# Patient Record
Sex: Female | Born: 1980 | Race: White | Hispanic: No | Marital: Married | Smoking: Never smoker
Health system: Southern US, Community
[De-identification: ages and names within clinical notes are randomized; demographics above are authoritative.]

## PROBLEM LIST (undated history)

## (undated) DIAGNOSIS — G43909 Migraine, unspecified, not intractable, without status migrainosus: Secondary | ICD-10-CM

## (undated) DIAGNOSIS — F419 Anxiety disorder, unspecified: Secondary | ICD-10-CM

## (undated) DIAGNOSIS — Z8041 Family history of malignant neoplasm of ovary: Secondary | ICD-10-CM

## (undated) DIAGNOSIS — E7800 Pure hypercholesterolemia, unspecified: Secondary | ICD-10-CM

## (undated) DIAGNOSIS — Z8049 Family history of malignant neoplasm of other genital organs: Secondary | ICD-10-CM

## (undated) DIAGNOSIS — R7989 Other specified abnormal findings of blood chemistry: Secondary | ICD-10-CM

## (undated) DIAGNOSIS — Z1589 Genetic susceptibility to other disease: Secondary | ICD-10-CM

## (undated) HISTORY — DX: Migraine, unspecified, not intractable, without status migrainosus: G43.909

## (undated) HISTORY — DX: Family history of malignant neoplasm of ovary: Z80.41

## (undated) HISTORY — DX: Pure hypercholesterolemia, unspecified: E78.00

## (undated) HISTORY — PX: DILATION AND CURETTAGE OF UTERUS: SHX78

## (undated) HISTORY — DX: Anxiety disorder, unspecified: F41.9

## (undated) HISTORY — PX: CHOLECYSTECTOMY: SHX55

## (undated) HISTORY — DX: Genetic susceptibility to other disease: Z15.89

## (undated) HISTORY — DX: Other specified abnormal findings of blood chemistry: R79.89

## (undated) HISTORY — DX: Family history of malignant neoplasm of other genital organs: Z80.49

---

## 2000-06-25 ENCOUNTER — Other Ambulatory Visit: Admission: RE | Admit: 2000-06-25 | Discharge: 2000-06-25 | Payer: Self-pay | Admitting: Obstetrics and Gynecology

## 2001-07-04 ENCOUNTER — Other Ambulatory Visit: Admission: RE | Admit: 2001-07-04 | Discharge: 2001-07-04 | Payer: Self-pay | Admitting: Obstetrics and Gynecology

## 2002-07-07 ENCOUNTER — Other Ambulatory Visit: Admission: RE | Admit: 2002-07-07 | Discharge: 2002-07-07 | Payer: Self-pay | Admitting: Obstetrics and Gynecology

## 2003-04-15 ENCOUNTER — Inpatient Hospital Stay (HOSPITAL_COMMUNITY): Admission: AD | Admit: 2003-04-15 | Discharge: 2003-04-15 | Payer: Self-pay | Admitting: Obstetrics and Gynecology

## 2003-08-18 ENCOUNTER — Other Ambulatory Visit: Admission: RE | Admit: 2003-08-18 | Discharge: 2003-08-18 | Payer: Self-pay | Admitting: Obstetrics and Gynecology

## 2004-02-29 ENCOUNTER — Inpatient Hospital Stay (HOSPITAL_COMMUNITY): Admission: AD | Admit: 2004-02-29 | Discharge: 2004-03-02 | Payer: Self-pay | Admitting: Obstetrics and Gynecology

## 2004-02-29 ENCOUNTER — Encounter (INDEPENDENT_AMBULATORY_CARE_PROVIDER_SITE_OTHER): Payer: Self-pay | Admitting: *Deleted

## 2004-03-03 ENCOUNTER — Encounter: Admission: RE | Admit: 2004-03-03 | Discharge: 2004-04-01 | Payer: Self-pay | Admitting: Obstetrics and Gynecology

## 2004-08-08 ENCOUNTER — Other Ambulatory Visit: Admission: RE | Admit: 2004-08-08 | Discharge: 2004-08-08 | Payer: Self-pay | Admitting: Obstetrics and Gynecology

## 2005-03-16 ENCOUNTER — Encounter: Admission: RE | Admit: 2005-03-16 | Discharge: 2005-05-02 | Payer: Self-pay | Admitting: Family Medicine

## 2007-11-13 ENCOUNTER — Ambulatory Visit: Admission: AD | Admit: 2007-11-13 | Discharge: 2007-11-13 | Payer: Self-pay | Admitting: Obstetrics and Gynecology

## 2007-11-13 ENCOUNTER — Encounter (INDEPENDENT_AMBULATORY_CARE_PROVIDER_SITE_OTHER): Payer: Self-pay | Admitting: Obstetrics and Gynecology

## 2008-08-28 ENCOUNTER — Emergency Department (HOSPITAL_COMMUNITY): Admission: EM | Admit: 2008-08-28 | Discharge: 2008-08-29 | Payer: Self-pay | Admitting: Emergency Medicine

## 2009-05-03 ENCOUNTER — Ambulatory Visit: Payer: Self-pay | Admitting: Advanced Practice Midwife

## 2009-05-03 ENCOUNTER — Inpatient Hospital Stay (HOSPITAL_COMMUNITY): Admission: AD | Admit: 2009-05-03 | Discharge: 2009-05-03 | Payer: Self-pay | Admitting: Obstetrics and Gynecology

## 2009-05-26 ENCOUNTER — Inpatient Hospital Stay (HOSPITAL_COMMUNITY): Admission: AD | Admit: 2009-05-26 | Discharge: 2009-05-28 | Payer: Self-pay | Admitting: Obstetrics and Gynecology

## 2010-04-12 LAB — CBC
HCT: 32.6 % — ABNORMAL LOW (ref 36.0–46.0)
Hemoglobin: 11.1 g/dL — ABNORMAL LOW (ref 12.0–15.0)
MCV: 83.3 fL (ref 78.0–100.0)
MCV: 84.6 fL (ref 78.0–100.0)
Platelets: 164 10*3/uL (ref 150–400)
Platelets: 232 10*3/uL (ref 150–400)
RBC: 3.85 MIL/uL — ABNORMAL LOW (ref 3.87–5.11)
RDW: 15 % (ref 11.5–15.5)
RDW: 15 % (ref 11.5–15.5)
WBC: 8.2 10*3/uL (ref 4.0–10.5)

## 2010-04-12 LAB — RPR: RPR Ser Ql: NONREACTIVE

## 2010-06-07 NOTE — Op Note (Signed)
NAMERETINA, BERNARDY NO.:  [id number]   MEDICAL RECORD NO.:  [id number]          PATIENT TYPE:  AMB   LOCATION:  DFTL                          FACILITY:  [facility]   PHYSICIAN:  Randye Lobo, M.D.   DATE OF BIRTH:  1980/07/21   DATE OF PROCEDURE:  11/13/2007  DATE OF DISCHARGE:                               OPERATIVE REPORT   PREOPERATIVE DIAGNOSIS:  Missed abortion.   POSTOPERATIVE DIAGNOSIS:  Missed abortion.   PROCEDURE:  Dilation and evacuation.   SURGEON:  Randye Lobo, MD   ANESTHESIA:  MAC, paracervical block with 1% lidocaine.   IV FLUIDS:  600 mL Ringer's lactate.   ESTIMATED BLOOD LOSS:  Minimal.   URINE OUTPUT:  100 mL by I&O catheterization.   COMPLICATIONS:  None.   INDICATIONS FOR THE PROCEDURE:  The patient is a 30 year old gravida 3,  para 1-0-1-1 Caucasian female with a last menstrual period, September 28, 2007, and a known early pregnancy who has had fluctuating quantitative  beta-HCG levels.  The levels are as follows:  698.5 on November 08, 2007,  886.8 on November 10, 2007, and 767.2 on November 12, 2007.  The patient  has had serial ultrasounds with her most recent ultrasound on November 11, 2007, which documented a questionable intrauterine gestational sac  measuring 4+2 weeks in size.  The adnexal regions were unremarkable and  there was no evidence of any free fluid.  The patient has had some  suprapubic/left lower quadrant tenderness.  A plan is now made to  proceed with a dilation and evacuation procedure after risks, benefits,  and alternatives are reviewed.   FINDINGS:  Exam under anesthesia revealed a small slightly retroverted  uterus.  A small amount of products of conception were obtained.   PROCEDURE:  The patient was reidentified in the maternity admissions  area.  She did receive Ancef 1 g IV for antibiotic prophylaxis.   In the operating room, a MAC anesthetic was induced and the patient was  then placed in the  dorsal lithotomy position.  The lower abdomen and  vagina were sterilely prepped and the bladder was catheterized of urine.  She was sterilely draped.   An exam under anesthesia was performed.  A speculum was placed inside  the vagina.  A single-tooth tenaculum was placed on the anterior  cervical lip.  A paracervical block was performed in standard fashion  with a total of 10 mL of 1% lidocaine plain.  The uterus was then  sounded to 8 cm.  The cervix was then sequentially dilated to a #21  Pratt dilator.  The #7 suction-tip curette was then introduced through  the cervical os to the level of the uterine fundus then was withdrawn  slightly.  Proper suction was applied and while turning in a clockwise  fashion, the suction-tip curette was removed from within the uterine  cavity.  This was repeated an additional time.  A serrated curette was  then used to gently curette and examined the 4 quadrants of the uterus,  and there were no remaining products of conception appreciated.  The  suction-tip curette was introduced into the uterine cavity one final  time and proper suction was applied and the instrument was withdrawn  while turning it again in a clockwise fashion to remove any remaining  blood clots.  This was very minimal.   The single-tooth tenaculum was removed from the cervix.  There was some  bleeding noted from the tenaculum site and this responded to compression  with a ring forceps.  Hemostasis was then good.  All of the instruments  were removed from the vagina and the procedure was completed.  The  patient was cleansed with Betadine.  She was awakened and escorted to  the recovery room in stable and awake condition.  There were no  complications to the procedure.  All needle, instrument, and sponge  counts were correct.      Randye Lobo, M.D.  Electronically Signed     BES/MEDQ  D:  11/13/2007  T:  11/14/2007  Job:  [id number]

## 2010-10-24 LAB — CBC: MCHC: 33.1

## 2011-02-02 ENCOUNTER — Other Ambulatory Visit: Payer: Self-pay | Admitting: Family Medicine

## 2011-02-02 DIAGNOSIS — G44009 Cluster headache syndrome, unspecified, not intractable: Secondary | ICD-10-CM

## 2011-02-06 ENCOUNTER — Ambulatory Visit
Admission: RE | Admit: 2011-02-06 | Discharge: 2011-02-06 | Disposition: A | Discharge status: Home / Self Care | Payer: 59 | Source: Ambulatory Visit | Attending: Family Medicine | Admitting: Family Medicine

## 2011-02-06 DIAGNOSIS — G44009 Cluster headache syndrome, unspecified, not intractable: Secondary | ICD-10-CM

## 2011-12-14 ENCOUNTER — Other Ambulatory Visit (HOSPITAL_COMMUNITY): Payer: Self-pay | Admitting: Obstetrics and Gynecology

## 2011-12-14 DIAGNOSIS — Z30431 Encounter for routine checking of intrauterine contraceptive device: Secondary | ICD-10-CM

## 2011-12-14 DIAGNOSIS — O029 Abnormal product of conception, unspecified: Secondary | ICD-10-CM

## 2011-12-19 ENCOUNTER — Ambulatory Visit (HOSPITAL_COMMUNITY)
Admission: RE | Admit: 2011-12-19 | Discharge: 2011-12-19 | Disposition: A | Discharge status: Home / Self Care | Payer: 59 | Source: Ambulatory Visit | Attending: Obstetrics and Gynecology | Admitting: Obstetrics and Gynecology

## 2011-12-19 DIAGNOSIS — N83209 Unspecified ovarian cyst, unspecified side: Secondary | ICD-10-CM | POA: Insufficient documentation

## 2011-12-19 DIAGNOSIS — O0289 Other abnormal products of conception: Secondary | ICD-10-CM | POA: Insufficient documentation

## 2011-12-19 DIAGNOSIS — O029 Abnormal product of conception, unspecified: Secondary | ICD-10-CM

## 2011-12-19 DIAGNOSIS — Z30431 Encounter for routine checking of intrauterine contraceptive device: Secondary | ICD-10-CM | POA: Insufficient documentation

## 2012-06-21 ENCOUNTER — Ambulatory Visit (INDEPENDENT_AMBULATORY_CARE_PROVIDER_SITE_OTHER): Payer: BC Managed Care – PPO | Admitting: Obstetrics & Gynecology

## 2012-06-21 ENCOUNTER — Encounter: Payer: Self-pay | Admitting: Obstetrics & Gynecology

## 2012-06-21 VITALS — BP 122/78 | HR 64 | Resp 16

## 2012-06-21 DIAGNOSIS — Z124 Encounter for screening for malignant neoplasm of cervix: Secondary | ICD-10-CM

## 2012-06-21 DIAGNOSIS — Z3040 Encounter for surveillance of contraceptives, unspecified: Secondary | ICD-10-CM

## 2012-06-21 MED ORDER — LEVONORGESTREL-ETHINYL ESTRAD 90-20 MCG PO TABS: 1.0000 | ORAL_TABLET | Freq: Every day | ORAL | Status: DC

## 2012-06-21 NOTE — Patient Instructions (Signed)
Needs AEX in one year.  Please call if you have any problems before that time.

## 2012-06-21 NOTE — Progress Notes (Signed)
32 y.o. [id number] MarriedCaucasianF here for discussion of newly starting OCP.  She is a patient of Dr. Edward Jolly.  Due to appointment scheduling issues, she is seeing me today.  She needs a Pap smear today.  Doing well on OCP.  Cycles are regular.  Flow lasted 8 days.  LMP 04/06/12.  Now taking continuously and has had not cycles or spotting or breakthrough bleeding.  No acne.  Feeling a little moody but she has been experienced this for several years.   Today she denies vaginal symptoms or STD concerns.  LMP: April 06, 2012.  Contraception:OCPs.  EXAM: BP 122/78  Pulse 64  Resp 16  LMP 04/08/2012 General appearance:  WNWD Female, NAD Pelvic exam: normal external genitalia, vulva, vagina, cervix, uterus and adnexa. Pap smear obtained.  Assessment: History of New start to continuous OCP.  Doing well.  Nl pelvic exam today.  Plan: AEX one year.  Rx for OCP to pharmacy for one year.

## 2012-11-01 ENCOUNTER — Ambulatory Visit (INDEPENDENT_AMBULATORY_CARE_PROVIDER_SITE_OTHER): Payer: BC Managed Care – PPO

## 2012-11-01 DIAGNOSIS — Z23 Encounter for immunization: Secondary | ICD-10-CM

## 2013-06-23 ENCOUNTER — Ambulatory Visit (INDEPENDENT_AMBULATORY_CARE_PROVIDER_SITE_OTHER): Payer: 59 | Admitting: Obstetrics and Gynecology

## 2013-06-23 ENCOUNTER — Encounter: Payer: Self-pay | Admitting: Obstetrics and Gynecology

## 2013-06-23 VITALS — BP 120/76 | HR 70 | Ht 66.0 in | Wt 165.8 lb

## 2013-06-23 DIAGNOSIS — Z0000 Encounter for general adult medical examination without abnormal findings: Secondary | ICD-10-CM

## 2013-06-23 DIAGNOSIS — N644 Mastodynia: Secondary | ICD-10-CM

## 2013-06-23 DIAGNOSIS — Z01419 Encounter for gynecological examination (general) (routine) without abnormal findings: Secondary | ICD-10-CM

## 2013-06-23 DIAGNOSIS — R6889 Other general symptoms and signs: Secondary | ICD-10-CM

## 2013-06-23 LAB — POCT URINALYSIS DIPSTICK
Bilirubin, UA: NEGATIVE
Blood, UA: NEGATIVE
Glucose, UA: NEGATIVE
Ketones, UA: NEGATIVE
LEUKOCYTES UA: NEGATIVE
Nitrite, UA: NEGATIVE
PH UA: 7
Protein, UA: NEGATIVE
Urobilinogen, UA: NEGATIVE

## 2013-06-23 LAB — CBC
HEMATOCRIT: 38.6 % (ref 36.0–46.0)
HEMOGLOBIN: 12.8 g/dL (ref 12.0–15.0)
MCH: 27.8 pg (ref 26.0–34.0)
MCHC: 33.2 g/dL (ref 30.0–36.0)
MCV: 83.9 fL (ref 78.0–100.0)
PLATELETS: 234 10*3/uL (ref 150–400)
RBC: 4.6 MIL/uL (ref 3.87–5.11)
RDW: 13.4 % (ref 11.5–15.5)
WBC: 6.1 10*3/uL (ref 4.0–10.5)

## 2013-06-23 MED ORDER — LEVONORGESTREL-ETHINYL ESTRAD 90-20 MCG PO TABS: 1.0000 | ORAL_TABLET | Freq: Every day | ORAL | Status: DC

## 2013-06-23 NOTE — Progress Notes (Signed)
Patient ID: Jane Vaughn, female   DOB: 12/15/1980, 33 y.o.   MRN: [id number] GYNECOLOGY VISIT  PCP:   Sigmund HazelLisa Miller, MD  Referring provider:   HPI: 33 y.o.   Married  Caucasian  female   [phone] with Patient's last menstrual period was 06/23/2012.   here for  AEX.    Having recurring breast pain.  Random shooting pain across the left breast toward the nipple for the last couple of months.  No new exercise or heavy lifting.  No palpable lumps. Coffee in am.  No other caffeine.   Family history of breast, ovarian, and uterine cancer.  Ovarian cancer - Paternal grandmother, maternal aunt (the one that also had uterine cancer). Breast cancer - 2 or 3 maternal aunts, one at age 34. Uterine cancer - maternal aunt, age 34.  Hairy cell leukemia - father.   Heat intolerance.  Sleep disturbance.   More frequent headaches.  Birth control pills are not helping headaches.  Takes Naproxyn or Imitrex almost daily.  Wants permanent contraception.  Intolerant of IUDs. Husband will not do vasectomy.   Hgb:    13.2 Urine:  Neg  GYNECOLOGIC HISTORY: Patient's last menstrual period was 06/23/2012. Sexually active:  yes Partner preference: female Contraception: OCP's--Amethyst   Menopausal hormone therapy: n/a DES exposure:  no  Blood transfusions:   no Sexually transmitted diseases:  no  GYN procedures and prior surgeries:  D & C x2 Last mammogram:  n/a               Last pap and high risk HPV testing:   06-21-12 wnl:neg HR HPV History of abnormal pap smear:  no   OB History   Grav Para Term Preterm Abortions TAB SAB Ect Mult Living   5    3 1 2   2        LIFESTYLE: Exercise:    no           Tobacco:    no Alcohol:      2-3 drinks per week Drug use:  no  OTHER HEALTH MAINTENANCE: Tetanus/TDap:   2010 Gardisil:              n/a Influenza:             10/2012 Zostavax:             n/a  Bone density:       n/a Colonoscopy:        n/a  Cholesterol check:   Years  ago  Family History  Problem Relation Age of Onset  . Ovarian cancer Maternal Aunt [id number]    deceased age 34  . Breast cancer Maternal Aunt 42  . Diabetes Paternal Uncle   . Diabetes Maternal Grandmother   . Hypertension Mother   . Hypertension Paternal Grandmother   . Ovarian cancer Paternal Grandmother [id number]  . Breast cancer Maternal Aunt [id number]    deceased age 34  . Cancer Maternal Aunt 51    ?endometrial ca  . Leukemia Father   . Stroke Paternal Grandfather     There are no active problems to display for this patient.  Past Medical History  Diagnosis Date  . Anxiety   . Migraines     menstrual    Past Surgical History  Procedure Laterality Date  . Dilation and curettage of uterus      x2    ALLERGIES: Topamax  Current Outpatient Prescriptions  Medication Sig Dispense Refill  . levonorgestrel-ethinyl estradiol (  LYBREL,AMETHYST) 90-20 MCG tablet Take 1 tablet by mouth daily.  1 Package  13  . naproxen (NAPROSYN) 250 MG tablet Take 250 mg by mouth 2 (two) times daily with a meal.      . SUMAtriptan (IMITREX) 100 MG tablet Take 100 mg by mouth every 2 (two) hours as needed for migraine or headache. May repeat in 2 hours if headache persists or recurs.       No current facility-administered medications for this visit.     ROS:  Pertinent items are noted in HPI.  SOCIAL HISTORY:  Clinical research associate  PHYSICAL EXAMINATION:    BP 120/76  Pulse 70  Ht 5\' 6"  (1.676 m)  Wt 165 lb 12.8 oz (75.206 kg)  BMI 26.77 kg/m2  LMP 06/23/2012   Wt Readings from Last 3 Encounters:  06/23/13 165 lb 12.8 oz (75.206 kg)     Ht Readings from Last 3 Encounters:  06/23/13 5\' 6"  (1.676 m)    General appearance: alert, cooperative and appears stated age Head: Normocephalic, without obvious abnormality, atraumatic Neck: no adenopathy, supple, symmetrical, trachea midline and thyroid not enlarged, symmetric, no tenderness/mass/nodules Lungs: clear to auscultation  bilaterally Breasts: Inspection negative, No nipple retraction or dimpling, No nipple discharge or bleeding, No axillary or supraclavicular adenopathy, Normal to palpation without dominant masses Heart: regular rate and rhythm Abdomen: soft, non-tender; no masses,  no organomegaly Extremities: extremities normal, atraumatic, no cyanosis or edema Skin: Skin color, texture, turgor normal. No rashes or lesions Lymph nodes: Cervical, supraclavicular, and axillary nodes normal. No abnormal inguinal nodes palpated Neurologic: Grossly normal  Pelvic: External genitalia:  no lesions              Urethra:  normal appearing urethra with no masses, tenderness or lesions              Bartholins and Skenes: normal                 Vagina: normal appearing vagina with normal color and discharge, no lesions              Cervix: normal appearance              Pap and high risk HPV testing done: no.            Bimanual Exam:  Uterus:  uterus is normal size, shape, consistency and nontender                                      Adnexa: normal adnexa in size, nontender and no masses                                ASSESSMENT  Normal gynecologic exam. Left mastalgia. Headaches with Lybrel. Desire for permanent sterilization.   PLAN  Diagnostic bilateral ammogram and left breast ultrasound. Pap smear and high risk HPV testing not indicated. Refill of OCPs - Lybrel -  for one year.  Labs - Lipid profile, CMP, CBC, TFTs. Proceed with Laparoscopic Bilateral salpingectomy.  Will precert and then schedule. Will need to return for preop visit.  Counseled on self breast exam, Calcium and vitamin D intake, exercise. Return annually or prn   An After Visit Summary was printed and given to the patient.

## 2013-06-23 NOTE — Patient Instructions (Addendum)
Laparoscopic Tubal Ligation Laparoscopic tubal ligation is a procedure that closes the fallopian tubes at a time other than right after childbirth. By closing the fallopian tubes, the eggs that are released from the ovaries cannot enter the uterus and sperm cannot reach the egg. Tubal ligation is also known as getting your "tubes tied." Tubal ligation is done so you will not be able to get pregnant or have a baby.  Although this procedure may be reversed, it should be considered permanent and irreversible. If you want to have future pregnancies, you should not have this procedure.  LET YOUR CAREGIVER KNOW ABOUT:  Allergies to food or medicine.  Medicines taken, including vitamins, herbs, eyedrops, over-the-counter medicines, and creams.  Use of steroids (by mouth or creams).  Previous problems with numbing medicines.  History of bleeding problems or blood clots.  Any recent colds or infections.  Previous surgery.  Other health problems, including diabetes and kidney problems.  Possibility of pregnancy, if this applies.  Any past pregnancies. RISKS AND COMPLICATIONS   Infection.  Bleeding.  Injury to surrounding organs.  Anesthetic side effects.  Failure of the procedure.  Ectopic pregnancy.  Future regret about having the procedure done. BEFORE THE PROCEDURE  Do not take aspirin or blood thinners a week before the procedure or as directed. This can cause bleeding.  Do not eat or drink anything 6 to 8 hours before the procedure. PROCEDURE   You may be given a medicine to help you relax (sedative) before the procedure. You will be given a medicine to make you sleep (general anesthetic) during the procedure.  A tube will be put down your throat to help your breath while under general anesthesia.  Two small cuts (incisions) are made in the lower abdominal area and near the belly button.  Your abdominal area will be inflated with a safe gas (carbon dioxide). This helps  give the surgeon room to operate, visualize, and helps the surgeon avoid other organs.  A thin, lighted tube (laparoscope) with a camera attached is inserted into your abdomen through one of the incisions near the belly button. Other small instruments are also inserted through the other abdominal incision.  The fallopian tubes are located and are either blocked with a ring, clip, or are burned (cauterized).  After the fallopian tubes are blocked, the gas is released from the abdomen.  The incisions will be closed with stitches (sutures), and a bandage may be placed over the incisions. AFTER THE PROCEDURE   You will rest in a recovery room for 1 4 hours until you are stable and doing well.  You will also have some mild abdominal discomfort for 3 7 days. You will be given pain medicine to ease any discomfort.  As long as there are no problems, you may be allowed to go home. Someone will need to drive you home and be with you for at least 24 hours once home.  You may have some mild discomfort in the throat. This is from the tube placed in your throat while you were sleeping.  You may experience discomfort in the shoulder area from some trapped air between the liver and diaphragm. This sensation is normal and will slowly go away on its own. Document Released: 04/17/2000 Document Revised: 07/11/2011 Document Reviewed: 04/22/2011 [facility] Patient Information 2014 [employer] [place].  EXERCISE AND DIET:  We recommended that you start or continue a regular exercise program for good health. Regular exercise means any activity that makes your heart beat  faster and makes you sweat.  We recommend exercising at least 30 minutes per day at least 3 days a week, preferably 4 or 5.  We also recommend a diet low in fat and sugar.  Inactivity, poor dietary choices and obesity can cause diabetes, heart attack, stroke, and kidney damage, among others.    ALCOHOL AND SMOKING:  Women should limit their alcohol intake  to no more than 7 drinks/beers/glasses of wine (combined, not each!) per week. Moderation of alcohol intake to this level decreases your risk of breast cancer and liver damage. And of course, no recreational drugs are part of a healthy lifestyle.  And absolutely no smoking or even second hand smoke. Most people know smoking can cause heart and lung diseases, but did you know it also contributes to weakening of your bones? Aging of your skin?  Yellowing of your teeth and nails?  CALCIUM AND VITAMIN D:  Adequate intake of calcium and Vitamin D are recommended.  The recommendations for exact amounts of these supplements seem to change often, but generally speaking 600 mg of calcium (either carbonate or citrate) and 800 units of Vitamin D per day seems prudent. Certain women may benefit from higher intake of Vitamin D.  If you are among these women, your doctor will have told you during your visit.    PAP SMEARS:  Pap smears, to check for cervical cancer or precancers,  have traditionally been done yearly, although recent scientific advances have shown that most women can have pap smears less often.  However, every woman still should have a physical exam from her gynecologist every year. It will include a breast check, inspection of the vulva and vagina to check for abnormal growths or skin changes, a visual exam of the cervix, and then an exam to evaluate the size and shape of the uterus and ovaries.  And after 34 years of age, a rectal exam is indicated to check for rectal cancers. We will also provide age appropriate advice regarding health maintenance, like when you should have certain vaccines, screening for sexually transmitted diseases, bone density testing, colonoscopy, mammograms, etc.   MAMMOGRAMS:  All women over 69 years old should have a yearly mammogram. Many facilities now offer a "3D" mammogram, which may cost around $50 extra out of pocket. If possible,  we recommend you accept the option to have  the 3D mammogram performed.  It both reduces the number of women who will be called back for extra views which then turn out to be normal, and it is better than the routine mammogram at detecting truly abnormal areas.    COLONOSCOPY:  Colonoscopy to screen for colon cancer is recommended for all women at age 37.  We know, you hate the idea of the prep.  We agree, BUT, having colon cancer and not knowing it is worse!!  Colon cancer so often starts as a polyp that can be seen and removed at colonscopy, which can quite literally save your life!  And if your first colonoscopy is normal and you have no family history of colon cancer, most women don't have to have it again for 10 years.  Once every ten years, you can do something that may end up saving your life, right?  We will be happy to help you get it scheduled when you are ready.  Be sure to check your insurance coverage so you understand how much it will cost.  It may be covered as a preventative service at no  cost, but you should check your particular policy.

## 2013-06-23 NOTE — Progress Notes (Signed)
Dx bilateral mammogram and L breast US scheduled while in office for [facility] imaging for 07/02/13 at 0800. Patient agreeable to time/date/location, given printed copy of appointments as well.

## 2013-06-24 ENCOUNTER — Telehealth: Payer: Self-pay | Admitting: Obstetrics and Gynecology

## 2013-06-24 LAB — COMPREHENSIVE METABOLIC PANEL
ALT: 16 U/L (ref 0–35)
AST: 17 U/L (ref 0–37)
Albumin: 4.3 g/dL (ref 3.5–5.2)
Alkaline Phosphatase: 52 U/L (ref 39–117)
BUN: 9 mg/dL (ref 6–23)
CO2: 28 meq/L (ref 19–32)
CREATININE: 0.75 mg/dL (ref 0.50–1.10)
Calcium: 9.4 mg/dL (ref 8.4–10.5)
Chloride: 103 mEq/L (ref 96–112)
GLUCOSE: 95 mg/dL (ref 70–99)
POTASSIUM: 4.1 meq/L (ref 3.5–5.3)
SODIUM: 138 meq/L (ref 135–145)
Total Bilirubin: 0.3 mg/dL (ref 0.2–1.2)
Total Protein: 7.1 g/dL (ref 6.0–8.3)

## 2013-06-24 LAB — LIPID PANEL
CHOL/HDL RATIO: 4 ratio
CHOLESTEROL: 178 mg/dL (ref 0–200)
HDL: 44 mg/dL (ref >39)
LDL CALC: 111 mg/dL — AB (ref 0–99)
TRIGLYCERIDES: 115 mg/dL (ref <150)
VLDL: 23 mg/dL (ref 0–40)

## 2013-06-24 LAB — THYROID PANEL WITH TSH
FREE THYROXINE INDEX: 2.5 (ref 1.0–3.9)
T3 UPTAKE: 26 % (ref 22.5–37.0)
T4 TOTAL: 9.5 ug/dL (ref 5.0–12.5)
TSH: 2.534 u[IU]/mL (ref 0.350–4.500)

## 2013-06-24 LAB — HEMOGLOBIN, FINGERSTICK: Hemoglobin, fingerstick: 13.2 g/dL (ref 12.0–16.0)

## 2013-06-24 NOTE — Telephone Encounter (Signed)
Left message for patient to call back. Need to go over surgery benefits. °

## 2013-06-24 NOTE — Telephone Encounter (Signed)
Spoke with patient. Advised that per benefit quote received, she will be responsible for $761.48 for the surgeon portion of her surgery. Payment will be due in full at least 2 weeks prior to the scheduled surgery date. Patient agreeable and would like to proceed with scheduling. She will not be available for surgery the week of June 22.

## 2013-07-02 ENCOUNTER — Ambulatory Visit
Admission: RE | Admit: 2013-07-02 | Discharge: 2013-07-02 | Disposition: A | Discharge status: Home / Self Care | Payer: 59 | Source: Ambulatory Visit | Attending: Obstetrics and Gynecology | Admitting: Obstetrics and Gynecology

## 2013-07-02 ENCOUNTER — Other Ambulatory Visit: Payer: Self-pay | Admitting: Obstetrics and Gynecology

## 2013-07-02 DIAGNOSIS — N644 Mastodynia: Secondary | ICD-10-CM

## 2013-07-10 ENCOUNTER — Telehealth: Payer: Self-pay | Admitting: *Deleted

## 2013-07-10 NOTE — Telephone Encounter (Signed)
Call to patient, LMTCB. Call to work number, no longer employed there.  [facility]. Will respond to this asking her to call office since not active on MyChart.

## 2013-07-10 NOTE — Telephone Encounter (Signed)
Call to patient. LMTCB. VM confirms first and last name. No ROI to leave detailed message.

## 2013-07-10 NOTE — Telephone Encounter (Signed)
Did not [facility]. Is not active on MyChart so will await call from patient.  Need date preferences.

## 2013-07-11 NOTE — Telephone Encounter (Signed)
Patient returned call. Has been on vacation and unable to return calls. Discussed available date options and 08-11-13 is agreeable to patient. Case request has been sent but still awaiting posting from hospital. Surgery  Instruction sheet reviewed and will mail to her once date confirmed.

## 2013-07-14 NOTE — Telephone Encounter (Signed)
July 14, 2013   Dear Ms. Juluis PitchJennifer Supan,  Your surgery is scheduled for July 20.  Upon requesting authorization for your surgery, your insurance company has informed us that they will cover 80% of the charges after a $500 deductible, and you will be responsible to pay approximately $761.48.  It is our office policy that this amount is paid in full two weeks prior to your surgery. Your payment is due on July 28, 2013.  If there is a balance due after your insurance company pays their portion, we will send you a bill.  If there is a refund due to you, we will send you a check within one month.  Payment may be made by cash, check, Visa, [employer] or Discover. Payment can be made in the office or over the telephone.  If payment is not made two weeks prior to your surgery, we will have to reschedule your surgery.  The above fee includes only our fee for the surgery and does not include charges you may have from the facility, anesthesia or pathology.  If you have any questions, please call us at [phone].

## 2013-07-18 NOTE — Telephone Encounter (Signed)
Call to patient to advise surgery is scheduled for 08-11-13 as previously discussed. Need to schedule pre/post surgery appointments.  Surgery instructions previously reviewed. LMTCB.

## 2013-07-18 NOTE — Telephone Encounter (Signed)
Patient returned call. Surgery date reviewed, quick review of previously given surgery instructions and sheet mailed. Appointments scheduled.  Routing to provider for final review. Patient agreeable to disposition. Will close encounter

## 2013-07-21 NOTE — Telephone Encounter (Signed)
[facility], patient has satisfied [phone] towards her $500 deductible. Called patient to advise of new patient responsibility of $610.89. Patient agreeable. Will call on Monday to make the payment.

## 2013-07-29 ENCOUNTER — Encounter (HOSPITAL_COMMUNITY): Payer: Self-pay | Admitting: Pharmacist

## 2013-07-31 ENCOUNTER — Ambulatory Visit (INDEPENDENT_AMBULATORY_CARE_PROVIDER_SITE_OTHER): Payer: 59

## 2013-07-31 ENCOUNTER — Encounter: Payer: Self-pay | Admitting: Obstetrics and Gynecology

## 2013-07-31 ENCOUNTER — Ambulatory Visit (INDEPENDENT_AMBULATORY_CARE_PROVIDER_SITE_OTHER): Payer: 59 | Admitting: Obstetrics and Gynecology

## 2013-07-31 VITALS — BP 120/80 | HR 60 | Ht 66.0 in | Wt 163.0 lb

## 2013-07-31 DIAGNOSIS — R1032 Left lower quadrant pain: Secondary | ICD-10-CM

## 2013-07-31 DIAGNOSIS — Z309 Encounter for contraceptive management, unspecified: Secondary | ICD-10-CM

## 2013-07-31 NOTE — Progress Notes (Signed)
GYNECOLOGY VISIT  PCP:   Referring provider:   HPI: 33 y.o.   Married  Caucasian  female   [phone] with No LMP recorded. Patient is not currently having periods (Reason: Oral contraceptives).   here for  Pre- op for  LAPAROSCOPIC BILATERAL SALPINGECTOMY for permanent contraception.   Patient has constant left sided abdominal pain. Nothing makes it better or worse.  Not related to bowel function. Changed the type of clothing she wears due to the pain.  History of ovarian cyst.   Family history of breast, ovarian, and uterine cancer.  Ovarian cancer - Paternal grandmother, maternal aunt (the one that also had uterine cancer).  Breast cancer - 2 or 3 maternal aunts, one at age 2.  Uterine cancer - maternal aunt, age 25.  Hairy cell leukemia - father.   More frequent headaches. Birth control pills are not helping headaches.  Takes Naproxyn or Imitrex almost daily.  Wants permanent contraception.  Intolerant of IUDs.  Husband will not do vasectomy.   Urine:  Unable to void  GYNECOLOGIC HISTORY: No LMP recorded. Patient is not currently having periods (Reason: Oral contraceptives). Sexually active: Yes  Partner preference:Female  Contraception:   Pill Menopausal hormone therapy: N/A  DES exposure:   No Blood transfusions: No   Sexually transmitted diseases: No   GYN procedures and prior surgeries: Dilation and Curettage of Uterus  Last mammogram:   07/07/13,[facility]- Negative             Last pap and high risk HPV testing: 06/05/13, Negative   History of abnormal pap smear:  No   OB History   Grav Para Term Preterm Abortions TAB SAB Ect Mult Living   5    3 1 2   2        LIFESTYLE: Exercise:  Not at the moment             Tobacco: No Alcohol:No Drug use: No   There are no active problems to display for this patient.   Past Medical History  Diagnosis Date  . Anxiety   . Migraines     menstrual    Past Surgical History  Procedure Laterality Date  . Dilation  and curettage of uterus      x2    Current Outpatient Prescriptions  Medication Sig Dispense Refill  . diphenhydrAMINE (SOMINEX) 25 MG tablet Take 25 mg by mouth at bedtime as needed for sleep.      Marland Kitchen levonorgestrel-ethinyl estradiol (LYBREL,AMETHYST) 90-20 MCG tablet Take 1 tablet by mouth daily.  1 Package  12  . naproxen (NAPROSYN) 250 MG tablet Take 250 mg by mouth daily as needed.       . SUMAtriptan (IMITREX) 100 MG tablet Take 100 mg by mouth every 2 (two) hours as needed for migraine or headache. May repeat in 2 hours if headache persists or recurs.       No current facility-administered medications for this visit.     ALLERGIES: Topamax  Family History  Problem Relation Age of Onset  . Ovarian cancer Maternal Aunt 34    deceased age 34  . Breast cancer Maternal Aunt 42  . Diabetes Paternal Uncle   . Diabetes Maternal Grandmother   . Hypertension Mother   . Hypertension Paternal Grandmother   . Ovarian cancer Paternal Grandmother 37  . Cancer Paternal Grandmother 63    Ovarian  . Breast cancer Maternal Aunt 44    deceased age 4  . Cancer Maternal Aunt 51    ?  endometrial ca  . Leukemia Father   . Stroke Paternal Grandfather     History   Social History  . Marital Status: Married    Spouse Name: N/A    Number of Children: N/A  . Years of Education: N/A   Occupational History  . Not on file.   Social History Main Topics  . Smoking status: Never Smoker   . Smokeless tobacco: Not on file  . Alcohol Use: 1.0 oz/week    2 drink(s) per week  . Drug Use: No  . Sexual Activity: Yes    Birth Control/ Protection: OCP, Pill     Comment: Amethyst   Other Topics Concern  . Not on file   Social History Narrative  . No narrative on file    ROS:  Pertinent items are noted in HPI.  PHYSICAL EXAMINATION:    BP 120/80  Pulse 60  Ht 5\' 6"  (1.676 m)  Wt 163 lb (73.936 kg)  BMI 26.32 kg/m2   Wt Readings from Last 3 Encounters:  07/31/13 163 lb (73.936 kg)   06/23/13 165 lb 12.8 oz (75.206 kg)     Ht Readings from Last 3 Encounters:  07/31/13 5\' 6"  (1.676 m)  06/23/13 5\' 6"  (1.676 m)    General appearance: alert, cooperative and appears stated age Head: Normocephalic, without obvious abnormality, atraumatic Neck: no adenopathy, supple, symmetrical, trachea midline and thyroid not enlarged, symmetric, no tenderness/mass/nodules Lungs: clear to auscultation bilaterally Heart: regular rate and rhythm Abdomen: soft, non-tender; no masses,  no organomegaly Extremities: extremities normal, atraumatic, no cyanosis or edema Skin: Skin color, texture, turgor normal. No rashes or lesions Lymph nodes: Cervical, supraclavicular, and axillary nodes normal. No abnormal inguinal nodes palpated Neurologic: Grossly normal  Pelvic: External genitalia:  no lesions              Urethra:  normal appearing urethra with no masses, tenderness or lesions              Bartholins and Skenes: normal                 Vagina: normal appearing vagina with normal color and discharge, no lesions              Cervix: normal appearance                 Bimanual Exam:  Uterus:  uterus is normal size, shape, consistency and nontender                                      Adnexa: normal adnexa in size, nontender and no masses                                  ASSESSMENT  Desire for permanent sterilization.  Chronic left lower quadrant pain.   PLAN  Proceed with ultrasound now - normal as indicated below.  Plan for laparoscopic bilateral salpingectomy.  I discussed benefits and risks of the procedure which include but are not limited to bleeding, infection, damage to surrounding organs, reaction to anesthesia, pneumonia, DVT, PE, death, need for further surgery.  Surgical recovery expectations discussed.  Patient wishes to proceed.   An After Visit Summary was printed and given to the patient.  25 minutes face to face time of which over 505 was spent in counseling.

## 2013-07-31 NOTE — Progress Notes (Signed)
Ultrasound - normal uterus and ovaries.  No free fluid.

## 2013-08-04 ENCOUNTER — Encounter (HOSPITAL_COMMUNITY): Payer: Self-pay | Admitting: *Deleted

## 2013-08-05 ENCOUNTER — Telehealth: Payer: Self-pay | Admitting: *Deleted

## 2013-08-05 NOTE — Telephone Encounter (Signed)
Call to patient, LMTCB.  Surgery start time for Mon 08-11-13 moved up to 0900. Will need to arrive at 0730.

## 2013-08-06 NOTE — Telephone Encounter (Signed)
Pt returning call

## 2013-08-06 NOTE — Telephone Encounter (Signed)
Patient notified of time adjustment to 0900 start time and hospital will call her at end of week to confirm arrival time but should expect approximately 730.  Dr Edward JollySilva aware of time.  Routing tor Dr Hyacinth MeekerMiller for final review. (dr Edward JollySilva out of office) Patient agreeable to disposition. Will close encounter

## 2013-08-10 NOTE — H&P (Signed)
Jane Vaughn de Gwenevere Ghazi, MD at 07/31/2013 11:48 AM      Status: Signed            GYNECOLOGY VISIT   PCP:    Referring provider:    HPI: 33 y.o.   Married  Caucasian  female    [phone] with No LMP recorded. Patient is not currently having periods (Reason: Oral contraceptives).    here for  Pre- op for  LAPAROSCOPIC BILATERAL SALPINGECTOMY for permanent contraception.    Patient has constant left sided abdominal pain. Nothing makes it better or worse.   Not related to bowel function. Changed the type of clothing she wears due to the pain.   History of ovarian cyst.    Family history of breast, ovarian, and uterine cancer.   Ovarian cancer - Paternal grandmother, maternal aunt (the one that also had uterine cancer).   Breast cancer - 2 or 3 maternal aunts, one at age 76.   Uterine cancer - maternal aunt, age 54.   Hairy cell leukemia - father.    More frequent headaches. Birth control pills are not helping headaches.   Takes Naproxyn or Imitrex almost daily.   Wants permanent contraception.   Intolerant of IUDs.   Husband will not do vasectomy.    Urine:  Unable to void   GYNECOLOGIC HISTORY: No LMP recorded. Patient is not currently having periods (Reason: Oral contraceptives). Sexually active: Yes   Partner preference:Female   Contraception:   Pill Menopausal hormone therapy: N/A   DES exposure:   No Blood transfusions: No    Sexually transmitted diseases: No    GYN procedures and prior surgeries: Dilation and Curettage of Uterus   Last mammogram:   07/07/13,[facility]- Negative              Last pap and high risk HPV testing: 06/05/13, Negative    History of abnormal pap smear:  No    OB History     Grav  Para  Term  Preterm  Abortions  TAB  SAB  Ect  Mult  Living     5        3  1  2      2            LIFESTYLE: Exercise:  Not at the moment              Tobacco: No Alcohol:No Drug use: No    There are no active problems to display for this  patient.      Past Medical History   Diagnosis  Date   .  Anxiety     .  Migraines         menstrual         Past Surgical History   Procedure  Laterality  Date   .  Dilation and curettage of uterus           x2         Current Outpatient Prescriptions   Medication  Sig  Dispense  Refill   .  diphenhydrAMINE (SOMINEX) 25 MG tablet  Take 25 mg by mouth at bedtime as needed for sleep.         Marland Kitchen  levonorgestrel-ethinyl estradiol (LYBREL,AMETHYST) 90-20 MCG tablet  Take 1 tablet by mouth daily.   1 Package   12   .  naproxen (NAPROSYN) 250 MG tablet  Take 250 mg by mouth daily as needed.          Marland Kitchen  SUMAtriptan (IMITREX) 100 MG tablet  Take 100 mg by mouth every 2 (two) hours as needed for migraine or headache. May repeat in 2 hours if headache persists or recurs.             No current facility-administered medications for this visit.        ALLERGIES: Topamax    Family History   Problem  Relation  Age of Onset   .  Ovarian cancer  Maternal Aunt  [id number]       deceased age 34   .  Breast cancer  Maternal Aunt  42   .  Diabetes  Paternal Uncle     .  Diabetes  Maternal Grandmother     .  Hypertension  Mother     .  Hypertension  Paternal Grandmother     .  Ovarian cancer  Paternal Grandmother  [id number]   .  Cancer  Paternal Grandmother  [id number]       Ovarian   .  Breast cancer  Maternal Aunt  [id number]       deceased age 34   .  Cancer  Maternal Aunt  51       ?endometrial ca   .  Leukemia  Father     .  Stroke  Paternal Grandfather           History       Social History   .  Marital Status:  Married       Spouse Name:  N/A       Number of Children:  N/A   .  Years of Education:  N/A       Occupational History   .  Not on file.       Social History Main Topics   .  Smoking status:  Never Smoker    .  Smokeless tobacco:  Not on file   .  Alcohol Use:  1.0 oz/week       2 drink(s) per week   .  Drug Use:  No   .  Sexual Activity:  Yes       Birth Control/ Protection:   OCP, Pill         Comment: Amethyst       Other Topics  Concern   .  Not on file       Social History Narrative   .  No narrative on file        ROS:  Pertinent items are noted in HPI.   PHYSICAL EXAMINATION:     BP 120/80  Pulse 60  Ht 5\' 6"  (1.676 m)  Wt 163 lb (73.936 kg)  BMI 26.32 kg/m2    Wt Readings from Last 3 Encounters:   07/31/13  163 lb (73.936 kg)   06/23/13  165 lb 12.8 oz (75.206 kg)        Ht Readings from Last 3 Encounters:   07/31/13  5\' 6"  (1.676 m)   06/23/13  5\' 6"  (1.676 m)        General appearance: alert, cooperative and appears stated age Head: Normocephalic, without obvious abnormality, atraumatic Neck: no adenopathy, supple, symmetrical, trachea midline and thyroid not enlarged, symmetric, no tenderness/mass/nodules Lungs: clear to auscultation bilaterally Heart: regular rate and rhythm Abdomen: soft, non-tender; no masses,  no organomegaly Extremities: extremities normal, atraumatic, no cyanosis or edema Skin: Skin color, texture, turgor normal. No rashes or lesions Lymph nodes: Cervical, supraclavicular, and axillary nodes normal. No  abnormal inguinal nodes palpated Neurologic: Grossly normal   Pelvic: External genitalia:  no lesions              Urethra:  normal appearing urethra with no masses, tenderness or lesions              Bartholins and Skenes: normal                  Vagina: normal appearing vagina with normal color and discharge, no lesions              Cervix: normal appearance                  Bimanual Exam:  Uterus:  uterus is normal size, shape, consistency and nontender                                      Adnexa: normal adnexa in size, nontender and no masses                                   ASSESSMENT   Desire for permanent sterilization.   Chronic left lower quadrant pain.    PLAN   Proceed with ultrasound now - normal as indicated below.   Plan for laparoscopic bilateral salpingectomy.  I  discussed benefits and risks of the procedure which include but are not limited to bleeding, infection, damage to surrounding organs, reaction to anesthesia, pneumonia, DVT, PE, death, need for further surgery.   Surgical recovery expectations discussed.   Patient wishes to proceed.    Yamil Dougher E Vaughn de Gwenevere Ghazi, MD at 07/31/2013 12:52 PM     Status: Signed        Ultrasound - normal uterus and ovaries. No free fluid.        An After Visit Summary was printed and given to the patient.   25 minutes face to face time of which over 50% was spent in counseling.

## 2013-08-11 ENCOUNTER — Encounter (HOSPITAL_COMMUNITY): Payer: Self-pay | Admitting: *Deleted

## 2013-08-11 ENCOUNTER — Encounter (HOSPITAL_COMMUNITY): Payer: 59 | Admitting: Anesthesiology

## 2013-08-11 ENCOUNTER — Ambulatory Visit (HOSPITAL_COMMUNITY): Payer: 59 | Admitting: Anesthesiology

## 2013-08-11 ENCOUNTER — Encounter (HOSPITAL_COMMUNITY)
Admission: RE | Disposition: A | Discharge status: Home / Self Care | Payer: Self-pay | Source: Ambulatory Visit | Attending: Obstetrics and Gynecology

## 2013-08-11 ENCOUNTER — Ambulatory Visit (HOSPITAL_COMMUNITY)
Admission: RE | Admit: 2013-08-11 | Discharge: 2013-08-11 | Disposition: A | Discharge status: Home / Self Care | Payer: 59 | Source: Ambulatory Visit | Attending: Obstetrics and Gynecology | Admitting: Obstetrics and Gynecology

## 2013-08-11 DIAGNOSIS — N8000 Endometriosis of the uterus, unspecified: Secondary | ICD-10-CM

## 2013-08-11 DIAGNOSIS — Z302 Encounter for sterilization: Secondary | ICD-10-CM

## 2013-08-11 DIAGNOSIS — F411 Generalized anxiety disorder: Secondary | ICD-10-CM | POA: Insufficient documentation

## 2013-08-11 DIAGNOSIS — G8929 Other chronic pain: Secondary | ICD-10-CM | POA: Insufficient documentation

## 2013-08-11 DIAGNOSIS — R1032 Left lower quadrant pain: Secondary | ICD-10-CM | POA: Insufficient documentation

## 2013-08-11 DIAGNOSIS — R51 Headache: Secondary | ICD-10-CM | POA: Insufficient documentation

## 2013-08-11 DIAGNOSIS — N803 Endometriosis of pelvic peritoneum, unspecified: Secondary | ICD-10-CM | POA: Insufficient documentation

## 2013-08-11 DIAGNOSIS — N801 Endometriosis of ovary: Secondary | ICD-10-CM | POA: Insufficient documentation

## 2013-08-11 DIAGNOSIS — N949 Unspecified condition associated with female genital organs and menstrual cycle: Secondary | ICD-10-CM | POA: Insufficient documentation

## 2013-08-11 DIAGNOSIS — N80109 Endometriosis of ovary, unspecified side, unspecified depth: Secondary | ICD-10-CM | POA: Insufficient documentation

## 2013-08-11 HISTORY — PX: ABLATION ON ENDOMETRIOSIS: SHX5787

## 2013-08-11 HISTORY — PX: LAPAROSCOPIC BILATERAL SALPINGECTOMY: SHX5889

## 2013-08-11 LAB — PREGNANCY, URINE: PREG TEST UR: NEGATIVE

## 2013-08-11 LAB — CBC
HCT: 40.5 % (ref 36.0–46.0)
HEMOGLOBIN: 13.6 g/dL (ref 12.0–15.0)
MCH: 28.5 pg (ref 26.0–34.0)
MCHC: 33.6 g/dL (ref 30.0–36.0)
MCV: 84.9 fL (ref 78.0–100.0)
Platelets: 213 10*3/uL (ref 150–400)
RBC: 4.77 MIL/uL (ref 3.87–5.11)
RDW: 12.9 % (ref 11.5–15.5)
WBC: 4.8 10*3/uL (ref 4.0–10.5)

## 2013-08-11 LAB — BASIC METABOLIC PANEL
Anion gap: 11 (ref 5–15)
BUN: 11 mg/dL (ref 6–23)
CALCIUM: 9.3 mg/dL (ref 8.4–10.5)
CO2: 27 meq/L (ref 19–32)
CREATININE: 0.73 mg/dL (ref 0.50–1.10)
Chloride: 101 mEq/L (ref 96–112)
GFR calc Af Amer: >90 mL/min (ref >90)
GFR calc non Af Amer: >90 mL/min (ref >90)
GLUCOSE: 95 mg/dL (ref 70–99)
Potassium: 4 mEq/L (ref 3.7–5.3)
Sodium: 139 mEq/L (ref 137–147)

## 2013-08-11 SURGERY — SALPINGECTOMY, BILATERAL, LAPAROSCOPIC
Anesthesia: General | Site: Abdomen

## 2013-08-11 MED ORDER — CEFAZOLIN SODIUM-DEXTROSE 2-3 GM-% IV SOLR
INTRAVENOUS | Status: AC
  Filled 2013-08-11: qty 50

## 2013-08-11 MED ORDER — METOCLOPRAMIDE HCL 5 MG/ML IJ SOLN
INTRAMUSCULAR | Status: AC
  Filled 2013-08-11: qty 2

## 2013-08-11 MED ORDER — FENTANYL CITRATE 0.05 MG/ML IJ SOLN
INTRAMUSCULAR | Status: AC
  Filled 2013-08-11: qty 2

## 2013-08-11 MED ORDER — OXYCODONE-ACETAMINOPHEN 5-325 MG PO TABS
1.0000 | ORAL_TABLET | Freq: Once | ORAL | Status: AC
  Administered 2013-08-11: 1 via ORAL

## 2013-08-11 MED ORDER — ACETAMINOPHEN 160 MG/5ML PO SOLN
ORAL | Status: AC
  Administered 2013-08-11: 975 mg via ORAL
  Filled 2013-08-11: qty 40.6

## 2013-08-11 MED ORDER — DEXAMETHASONE SODIUM PHOSPHATE 10 MG/ML IJ SOLN
INTRAMUSCULAR | Status: DC | PRN
  Administered 2013-08-11: 10 mg via INTRAVENOUS

## 2013-08-11 MED ORDER — GLYCOPYRROLATE 0.2 MG/ML IJ SOLN
INTRAMUSCULAR | Status: DC | PRN
  Administered 2013-08-11: 0.6 mg via INTRAVENOUS

## 2013-08-11 MED ORDER — CEFAZOLIN SODIUM-DEXTROSE 2-3 GM-% IV SOLR
2.0000 g | INTRAVENOUS | Status: AC
  Administered 2013-08-11: 2 g via INTRAVENOUS

## 2013-08-11 MED ORDER — MEPERIDINE HCL 25 MG/ML IJ SOLN: 6.2500 mg | INTRAMUSCULAR | Status: DC | PRN

## 2013-08-11 MED ORDER — FENTANYL CITRATE 0.05 MG/ML IJ SOLN
INTRAMUSCULAR | Status: DC | PRN
  Administered 2013-08-11: 50 ug via INTRAVENOUS
  Administered 2013-08-11: 100 ug via INTRAVENOUS
  Administered 2013-08-11: 25 ug via INTRAVENOUS
  Administered 2013-08-11: 50 ug via INTRAVENOUS

## 2013-08-11 MED ORDER — LACTATED RINGERS IV SOLN
INTRAVENOUS | Status: DC
  Administered 2013-08-11 (×3): via INTRAVENOUS

## 2013-08-11 MED ORDER — NEOSTIGMINE METHYLSULFATE 10 MG/10ML IV SOLN
INTRAVENOUS | Status: DC | PRN
  Administered 2013-08-11: 4 mg via INTRAVENOUS

## 2013-08-11 MED ORDER — MIDAZOLAM HCL 2 MG/2ML IJ SOLN
INTRAMUSCULAR | Status: DC | PRN
  Administered 2013-08-11: 2 mg via INTRAVENOUS

## 2013-08-11 MED ORDER — LACTATED RINGERS IV SOLN: INTRAVENOUS | Status: DC

## 2013-08-11 MED ORDER — FENTANYL CITRATE 0.05 MG/ML IJ SOLN
INTRAMUSCULAR | Status: AC
  Filled 2013-08-11: qty 5

## 2013-08-11 MED ORDER — KETOROLAC TROMETHAMINE 30 MG/ML IJ SOLN: 15.0000 mg | Freq: Once | INTRAMUSCULAR | Status: DC | PRN

## 2013-08-11 MED ORDER — ONDANSETRON HCL 4 MG/2ML IJ SOLN
INTRAMUSCULAR | Status: DC | PRN
  Administered 2013-08-11: 4 mg via INTRAVENOUS

## 2013-08-11 MED ORDER — MIDAZOLAM HCL 2 MG/2ML IJ SOLN
INTRAMUSCULAR | Status: AC
  Filled 2013-08-11: qty 2

## 2013-08-11 MED ORDER — KETOROLAC TROMETHAMINE 30 MG/ML IJ SOLN
INTRAMUSCULAR | Status: DC | PRN
  Administered 2013-08-11: 30 mg via INTRAVENOUS

## 2013-08-11 MED ORDER — OXYCODONE-ACETAMINOPHEN 5-325 MG PO TABS: 2.0000 | ORAL_TABLET | ORAL | Status: DC | PRN

## 2013-08-11 MED ORDER — METOCLOPRAMIDE HCL 5 MG/ML IJ SOLN
10.0000 mg | Freq: Once | INTRAMUSCULAR | Status: AC | PRN
  Administered 2013-08-11: 10 mg via INTRAVENOUS

## 2013-08-11 MED ORDER — BUPIVACAINE HCL (PF) 0.25 % IJ SOLN
INTRAMUSCULAR | Status: AC
Start: 2013-08-11 — End: 2013-08-11
  Filled 2013-08-11: qty 30

## 2013-08-11 MED ORDER — OXYCODONE-ACETAMINOPHEN 5-325 MG PO TABS
ORAL_TABLET | ORAL | Status: AC
  Filled 2013-08-11: qty 1

## 2013-08-11 MED ORDER — LIDOCAINE HCL (CARDIAC) 20 MG/ML IV SOLN
INTRAVENOUS | Status: DC | PRN
  Administered 2013-08-11: 60 mg via INTRAVENOUS

## 2013-08-11 MED ORDER — BUPIVACAINE HCL (PF) 0.25 % IJ SOLN
INTRAMUSCULAR | Status: DC | PRN
  Administered 2013-08-11: 8 mL

## 2013-08-11 MED ORDER — IBUPROFEN 800 MG PO TABS: 800.0000 mg | ORAL_TABLET | Freq: Three times a day (TID) | ORAL | Status: DC | PRN

## 2013-08-11 MED ORDER — ROCURONIUM BROMIDE 100 MG/10ML IV SOLN
INTRAVENOUS | Status: DC | PRN
  Administered 2013-08-11: 40 mg via INTRAVENOUS

## 2013-08-11 MED ORDER — NEOSTIGMINE METHYLSULFATE 10 MG/10ML IV SOLN: INTRAVENOUS | Status: DC | PRN

## 2013-08-11 MED ORDER — PROPOFOL 10 MG/ML IV BOLUS
INTRAVENOUS | Status: DC | PRN
  Administered 2013-08-11: 50 mg via INTRAVENOUS
  Administered 2013-08-11: 150 mg via INTRAVENOUS

## 2013-08-11 MED ORDER — ACETAMINOPHEN 160 MG/5ML PO SOLN
975.0000 mg | Freq: Once | ORAL | Status: AC
  Administered 2013-08-11: 975 mg via ORAL

## 2013-08-11 MED ORDER — HEPARIN SODIUM (PORCINE) 5000 UNIT/ML IJ SOLN
INTRAMUSCULAR | Status: AC
  Filled 2013-08-11: qty 1

## 2013-08-11 MED ORDER — FENTANYL CITRATE 0.05 MG/ML IJ SOLN
25.0000 ug | INTRAMUSCULAR | Status: DC | PRN
  Administered 2013-08-11 (×2): 50 ug via INTRAVENOUS

## 2013-08-11 SURGICAL SUPPLY — 29 items
BAG SPEC RTRVL LRG 6X4 10 (ENDOMECHANICALS)
BARRIER ADHS 3X4 INTERCEED (GAUZE/BANDAGES/DRESSINGS) IMPLANT
BENZOIN TINCTURE PRP APPL 2/3 (GAUZE/BANDAGES/DRESSINGS) IMPLANT
BRR ADH 4X3 ABS CNTRL BYND (GAUZE/BANDAGES/DRESSINGS)
CABLE HIGH FREQUENCY MONO STRZ (ELECTRODE) IMPLANT
CANISTER SUCT 3000ML (MISCELLANEOUS) IMPLANT
CLOTH BEACON ORANGE TIMEOUT ST (SAFETY) ×3 IMPLANT
DERMABOND ADVANCED (GAUZE/BANDAGES/DRESSINGS)
DERMABOND ADVANCED .7 DNX12 (GAUZE/BANDAGES/DRESSINGS) IMPLANT
DRSG COVADERM PLUS 2X2 (GAUZE/BANDAGES/DRESSINGS) ×6 IMPLANT
DRSG OPSITE POSTOP 3X4 (GAUZE/BANDAGES/DRESSINGS) ×3 IMPLANT
FORCEPS CUTTING 33CM 5MM (CUTTING FORCEPS) ×3 IMPLANT
GLOVE BIO SURGEON STRL SZ 6.5 (GLOVE) ×9 IMPLANT
GOWN STRL REUS W/TWL LRG LVL3 (GOWN DISPOSABLE) ×9 IMPLANT
NS IRRIG 1000ML POUR BTL (IV SOLUTION) ×3 IMPLANT
PACK LAPAROSCOPY BASIN (CUSTOM PROCEDURE TRAY) ×3 IMPLANT
POUCH SPECIMEN RETRIEVAL 10MM (ENDOMECHANICALS) IMPLANT
PROTECTOR NERVE ULNAR (MISCELLANEOUS) ×6 IMPLANT
SCISSORS LAP 5X35 DISP (ENDOMECHANICALS) ×3 IMPLANT
SET IRRIG TUBING LAPAROSCOPIC (IRRIGATION / IRRIGATOR) IMPLANT
SOLUTION ELECTROLUBE (MISCELLANEOUS) IMPLANT
STRIP CLOSURE SKIN 1/4X3 (GAUZE/BANDAGES/DRESSINGS) IMPLANT
SUT PLAIN 3 0 FS 2 27 (SUTURE) ×3 IMPLANT
SUT VICRYL 0 UR6 27IN ABS (SUTURE) ×3 IMPLANT
TOWEL OR 17X24 6PK STRL BLUE (TOWEL DISPOSABLE) ×6 IMPLANT
TRAY FOLEY CATH 14FR (SET/KITS/TRAYS/PACK) ×3 IMPLANT
TROCAR XCEL DIL TIP R 11M (ENDOMECHANICALS) IMPLANT
WARMER LAPAROSCOPE (MISCELLANEOUS) ×3 IMPLANT
WATER STERILE IRR 1000ML POUR (IV SOLUTION) IMPLANT

## 2013-08-11 NOTE — Anesthesia Postprocedure Evaluation (Signed)
  Anesthesia Post-op Note  Anesthesia Post Note  Patient: Jane Vaughn  Procedure(s) Performed: Procedure(s) (LRB): LAPAROSCOPIC BILATERAL SALPINGECTOMY (Bilateral) FULGERATION OF ENDOMETRIOSIS, PERITONEAL BIOPSY (N/A)  Anesthesia type: General  Patient location: PACU  Post pain: Pain level controlled  Post assessment: Post-op Vital signs reviewed  Last Vitals:  Filed Vitals:   08/11/13 1145  BP: 109/70  Pulse: 91  Temp: 37.2 C  Resp: 16    Post vital signs: Reviewed  Level of consciousness: sedated  Complications: No apparent anesthesia complications

## 2013-08-11 NOTE — Anesthesia Preprocedure Evaluation (Addendum)
Anesthesia Evaluation  Patient identified by MRN, date of birth, ID band Patient awake    Reviewed: Allergy & Precautions, H&P , NPO status , Patient's Chart, lab work & pertinent test results, reviewed documented beta blocker date and time   History of Anesthesia Complications Negative for: history of anesthetic complications  Airway Mallampati: I TM Distance: >3 FB Neck ROM: full    Dental  (+) Teeth Intact   Pulmonary neg pulmonary ROS,  breath sounds clear to auscultation  Pulmonary exam normal       Cardiovascular Exercise Tolerance: Good negative cardio ROS  Rhythm:regular Rate:Normal     Neuro/Psych  Headaches (last was 4 weeks ago), Anxiety    GI/Hepatic negative GI ROS, Neg liver ROS,   Endo/Other  negative endocrine ROS  Renal/GU negative Renal ROS  Female GU complaint (pelvic pain)     Musculoskeletal   Abdominal   Peds  Hematology negative hematology ROS (+)   Anesthesia Other Findings   Reproductive/Obstetrics negative OB ROS                          Anesthesia Physical Anesthesia Plan  ASA: II  Anesthesia Plan: General ETT   Post-op Pain Management:    Induction:   Airway Management Planned:   Additional Equipment:   Intra-op Plan:   Post-operative Plan:   Informed Consent: I have reviewed the patients History and Physical, chart, labs and discussed the procedure including the risks, benefits and alternatives for the proposed anesthesia with the patient or authorized representative who has indicated his/her understanding and acceptance.   Dental Advisory Given  Plan Discussed with: CRNA and Surgeon  Anesthesia Plan Comments:         Anesthesia Quick Evaluation

## 2013-08-11 NOTE — Progress Notes (Signed)
Update to History and Physical  No marked change in status since office preop visit.  Patient with chronic LLQ pain and negative ultrasound.  Patient examined.   OK to proceed with laparoscopic bilateral salpingectomy for permanent contraception and possible lysis of adhesions and treatment of endometriosis.

## 2013-08-11 NOTE — Anesthesia Procedure Notes (Signed)
Procedure Name: Intubation Date/Time: 08/11/2013 9:01 AM Performed by: Clare Fennimore, Jannet AskewHARLESETTA Vaughn Pre-anesthesia Checklist: Patient identified, Timeout performed, Emergency Drugs available, Suction available and Patient being monitored Patient Re-evaluated:Patient Re-evaluated prior to inductionOxygen Delivery Method: Circle system utilized Preoxygenation: Pre-oxygenation with 100% oxygen Intubation Type: IV induction Ventilation: Mask ventilation without difficulty Laryngoscope Size: Mac and 3 Grade View: Grade I Number of attempts: 1 Placement Confirmation: ETT inserted through vocal cords under direct vision,  breath sounds checked- equal and bilateral and positive ETCO2 Secured at: 21 cm Dental Injury: Teeth and Oropharynx as per pre-operative assessment

## 2013-08-11 NOTE — Brief Op Note (Signed)
08/11/2013  10:26 AM  PATIENT:  Minda MeoJennifer A Buchanan  33 y.o. female  PRE-OPERATIVE DIAGNOSIS:  desires sterilization, LLQ pain  POST-OPERATIVE DIAGNOSIS:  desires sterilization, LLQ pain, minimal endometriosis  PROCEDURE:  Procedure(s): LAPAROSCOPIC BILATERAL SALPINGECTOMY (Bilateral) FULGERATION OF ENDOMETRIOSIS, PERITONEAL BIOPSY (N/A)  SURGEON:  Surgeon(s) and Role:    * Vittorio Mohs E Amundson de Gwenevere Ghaziarvalho E Silva, MD - Primary    * Bennye Almracy H Lathrop, MD - Assisting  PHYSICIAN ASSISTANT:   ASSISTANTS:  Douglass Riversracy Lathrop, MD   ANESTHESIA:   local and general  EBL:  Total I/O In: 1000 [I.V.:1000] Out: 405 [Urine:400; Blood:5]  BLOOD ADMINISTERED:none  DRAINS: none   LOCAL MEDICATIONS USED:  MARCAINE     SPECIMEN:  Source of Specimen:   left pelvic sidewall biopsy and bilateral fallopian tubes  DISPOSITION OF SPECIMEN:  PATHOLOGY  COUNTS:  YES  TOURNIQUET:  * No tourniquets in log *  DICTATION: .Other Dictation: Dictation Number    PLAN OF CARE: Discharge to home after PACU  PATIENT DISPOSITION:  PACU - hemodynamically stable.   Delay start of Pharmacological VTE agent (>24hrs) due to surgical blood loss or risk of bleeding: not applicable

## 2013-08-11 NOTE — Transfer of Care (Signed)
Immediate Anesthesia Transfer of Care Note  Patient: Jane Vaughn  Procedure(s) Performed: Procedure(s): LAPAROSCOPIC BILATERAL SALPINGECTOMY (Bilateral) FULGERATION OF ENDOMETRIOSIS, PERITONEAL BIOPSY (N/A)  Patient Location: PACU  Anesthesia Type:General  Level of Consciousness: awake, alert  and sedated  Airway & Oxygen Therapy: Patient Spontanous Breathing and Patient connected to nasal cannula oxygen  Post-op Assessment: Report given to PACU RN and Post -op Vital signs reviewed and stable  Post vital signs: Reviewed and stable  Complications: No apparent anesthesia complications

## 2013-08-11 NOTE — Discharge Instructions (Signed)
Diagnostic Laparoscopy °Laparoscopy is a surgical procedure. It is used to diagnose and treat diseases inside the belly (abdomen). It is usually a brief, common, and relatively simple procedure. The laparoscopeis a thin, lighted, pencil-sized instrument. It is like a telescope. It is inserted into your abdomen through a small cut (incision). Your caregiver can look at the organs inside your body through this instrument. He or she can see if there is anything abnormal. °Laparoscopy can be done either in a hospital or outpatient clinic. You may be given a mild sedative to help you relax before the procedure. Once in the operating room, you will be given a drug to make you sleep (general anesthesia). Laparoscopy usually lasts less than 1 hour. After the procedure, you will be monitored in a recovery area until you are stable and doing well. Once you are home, it will take 2 to 3 days to fully recover. °RISKS AND COMPLICATIONS  °Laparoscopy has relatively few risks. Your caregiver will discuss the risks with you before the procedure. °Some problems that can occur include: °· Infection. °· Bleeding. °· Damage to other organs. °· Anesthetic side effects. °PROCEDURE °Once you receive anesthesia, your surgeon inflates the abdomen with a harmless gas (carbon dioxide). This makes the organs easier to see. The laparoscope is inserted into the abdomen through a small incision. This allows your surgeon to see into the abdomen. Other small instruments are also inserted into the abdomen through other small openings. Many surgeons attach a video camera to the laparoscope to enlarge the view. °During a diagnostic laparoscopy, the surgeon may be looking for inflammation, infection, or cancer. Your surgeon may take tissue samples(biopsies). The samples are sent to a specialist in looking at cells and tissue samples (pathologist). The pathologist examines them under a microscope. Biopsies can help to diagnose or confirm a  disease. °AFTER THE PROCEDURE  °· The gas is released from inside the abdomen. °· The incisions are closed with stitches (sutures). Because these incisions are small (usually less than 1/2 inch), there is usually minimal discomfort after the procedure. There may be some mild discomfort in the throat. This is from the tube placed in the throat while you were sleeping. You may have some mild abdominal discomfort. There may also be discomfort from the instrument placement incisions in the abdomen. °· The recovery time is shortened as long as there are no complications. °· You will rest in a recovery room until stable and doing well. As long as there are no complications, you may be allowed to go home. °FINDING OUT THE RESULTS OF YOUR TEST °Not all test results are available during your visit. If your test results are not back during the visit, make an appointment with your caregiver to find out the results. Do not assume everything is normal if you have not heard from your caregiver or the medical facility. It is important for you to follow up on all of your test results. °HOME CARE INSTRUCTIONS  °· Take all medicines as directed. °· Only take over-the-counter or prescription medicines for pain, discomfort, or fever as directed by your caregiver. °· Resume daily activities as directed. °· Showers are preferred over baths. °· You may resume sexual activities in 1 week or as directed. °· Do not drive while taking narcotics. °SEEK MEDICAL CARE IF:  °· There is increasing abdominal pain. °· There is new pain in the shoulders (shoulder strap areas). °· You feel lightheaded or faint. °· You have the chills. °· You or   your child has an oral temperature above 102° F (38.9° C). °· There is pus-like (purulent) drainage from any of the wounds. °· You are unable to pass gas or have a bowel movement. °· You feel sick to your stomach (nauseous) or throw up (vomit). °MAKE SURE YOU:  °· Understand these instructions. °· Will watch  your condition. °· Will get help right away if you are not doing well or get worse. °Document Released: 04/17/2000 Document Revised: 05/06/2012 Document Reviewed: 01/09/2007 °[employer]® Patient Information ©2015 [employer]. This information is not intended to replace advice given to you by your health care provider. Make sure you discuss any questions you have with your health care provider. ° °Post Anesthesia Home Care Instructions ° °Activity: °Get plenty of rest for the remainder of the day. A responsible adult should stay with you for 24 hours following the procedure.  °For the next 24 hours, DO NOT: °-Drive a car °-Operate machinery °-Drink alcoholic beverages °-Take any medication unless instructed by your physician °-Make any legal decisions or sign important papers. ° °Meals: °Start with liquid foods such as gelatin or soup. Progress to regular foods as tolerated. Avoid greasy, spicy, heavy foods. If nausea and/or vomiting occur, drink only clear liquids until the nausea and/or vomiting subsides. Call your physician if vomiting continues. ° °Special Instructions/Symptoms: °Your throat may feel dry or sore from the anesthesia or the breathing tube placed in your throat during surgery. If this causes discomfort, gargle with warm salt water. The discomfort should disappear within 24 hours. ° °

## 2013-08-12 ENCOUNTER — Encounter (HOSPITAL_COMMUNITY): Payer: Self-pay | Admitting: Obstetrics and Gynecology

## 2013-08-12 NOTE — Op Note (Signed)
NAMNoah Delaine:  Knapper, Maryum            ACCOUNT NO.:  [id number]  MEDICAL RECORD NO.:  [id number]  LOCATION:  [facility]                          [facility]ILITY:  [facility]  PHYSICIAN:  Randye LoboBrook E. Silva, M.D.   DATE OF BIRTH:  25-Mar-1980  DATE OF PROCEDURE:  08/11/2013 DATE OF DISCHARGE:  08/11/2013                              OPERATIVE REPORT   PREOPERATIVE DIAGNOSES:  Desire for permanent sterilization, left lower quadrant pain.  POSTOPERATIVE DIAGNOSES:  Desire for permanent sterilization, left lower quadrant pain, minimal endometriosis.  PROCEDURE:  Laparoscopic bilateral salpingectomy, left left peritoneal side wall Biopsy, fulguration of endometriosis.   SURGEON:  Randye LoboBrook E. Silva, M.D.  ASSISTANT:  Ivor Costaracy H. Farrel GobbleLathrop, M.D.  ANESTHESIA:  General endotracheal, local with 0.25% Marcaine.  IV FLUIDS:  1000 mL Ringer's lactate.  EBL:  5 mL.  URINE OUTPUT:  400 mL.  COMPLICATIONS:  None.  INDICATIONS FOR THE PROCEDURE:  The patient is a 33 year old, gravida 5, para 2 Caucasian female, currently on oral contraceptive, who desires permanent sterilization.  The patient also has chronic left lower quadrant pain.  There is a family history of a paternal grandmother with ovarian cancer.  The patient has had a normal pelvic ultrasound.  A plan is now made to proceed with a laparoscopic bilateral salpingectomy with possible treatment of adhesive disease and endometriosis.  Risks, benefits, and alternatives have been reviewed with the patient who wishes to proceed.  FINDINGS:  Examination under anesthesia revealed a small anteverted mobile uterus.  No adnexal masses were appreciated.  At the time of laparoscopy, the patient was noted to have an unremarkable uterus and bilateral fallopian tubes, and right ovary.  The left ovary had a few small 3-mm lesions of brownish endometriosis.  The left pelvic sidewall had a 3-mm brown lesion of endometriosis, which was excised.  Overlying the ureter on that  left-hand side was also an area of scarring measuring approximately 1 cm, which appeared to be old endometriosis.  The right pelvic sidewall similarly had a 3-mm brownish lesion of endometriosis.  The anterior and posterior cul-de-sacs were unremarkable.  There was no evidence of any adhesive disease in the abdomen or the pelvis.  The appendix was unremarkable.  The liver and stomach appeared to be normal.  SPECIMENS:  The left peritoneal biopsy was sent to Pathology separately from the bilateral fallopian tubes, which were sent together.  DESCRIPTION OF PROCEDURE:  The patient was reidentified in the preoperative hold area.  She received Ancef 2 g IV for antibiotic prophylaxis.  She received TED hose and PAS stockings for DVT prophylaxis.  In the operating room, the patient was placed in the supine position with her arms tucked at her sides.  She received general endotracheal anesthesia and was then placed in the dorsal lithotomy position.  The abdomen, vagina and perineum were then sterilely prepped with Betadine and she was draped.  The Foley catheter was placed inside the bladder. The speculum was placed inside the vagina and a single-tooth tenaculum was placed on the anterior cervical lip.  This was replaced with a Hulka tenaculum and all of the vaginal instruments were removed.  The procedure began by creating an umbilical incision with an 11-blade  scalpel after the skin was injected locally with 0.5% Marcaine. Dissection down to the fascia was performed with an Allis clamp.  A 10- mm trocar was then inserted directly into the peritoneal cavity and the laparoscope confirmed proper placement.  A CO2 pneumoperitoneum was achieved and the patient was placed in the Trendelenburg position.  The 5-mm incisions were then created in the right and the left lower quadrants again after the skin had been injected locally with 0.25% Marcaine.  The 5-mm trocars were placed under direct  visualization of the laparoscope.  An inspection of the pelvic and abdominal organs was performed and the findings are as noted above.  The procedure began by identifying the ureter on the left pelvic sidewall.  The gyrus instrument was used to perform the bilateral salpingectomies.  The gyrus instrument was placed along the mesosalpinx of the distal fallopian tube.  The instrument was used to do bipolar cautery and then cutting.  The proximal fallopian tube was then grasped and cauterized and bisected using the same instrument.  The left fallopian tube was then placed in the anterior cul-de-sac.  Hemostasis was excellent.  The same procedure that was performed on the left fallopian tube was then repeated on the right fallopian tube after the ureter was carefully identified along the pelvic sidewall.  After the right fallopian tube was completely excised, it was also placed in the anterior cul-de-sac.  Hemostasis was good along the dissection site of the right fallopian tube.  Attention was turned at this time to the left pelvic sidewall.  The peritoneum was grasped and was lifted away from the ureter.  Sharp dissection was used to carefully dissect the peritoneum from the overlying ureter in order to remove this small lesion of endometriosis. It was removed from within the peritoneal cavity.  The ureter was noted to peristalse along the left pelvic sidewall after excision of the peritoneal endometriosis.  Hemostasis was good.  The remaining lesions of endometriosis of the left ovary and the right pelvic sidewall were cauterized using the gyrus bipolar instrument.  The area of cautery of the right pelvic sidewall was well away from the ureter on that ipsilateral side.  The pneumoperitoneum was partially released at this time and all surgical sites were examined and found to be hemostatic.  A 5-mm laparoscope was placed through the right lower quadrant trocar and the fallopian  tubes were removed one by one through the 10-mm umbilical Trocar and then sent to pathology together.  The lower abdominal trocars  were removed under visualization of the laparoscope.  The pneumoperitoneum  was completely released with the assistance of giving manual breaths.   The umbilical trocar was removed.  The umbilical fascia was closed with a figure-of-eight suture of 0 Vicryl.  The skin incisions were closed with subcuticular sutures of 3-0 Vicryl.  Dermabond was placed over the incisions.  The incisions were then sterilely covered with bandages.  The Foley catheter and the Hulka tenaculum were removed.  The patient was cleansed of Betadine.  The patient was awakened and extubated, and escorted to the recovery room in stable condition.  There were no complications to the procedure. All needle, instrument, and sponge counts were correct.     Randye Lobo, M.D.     BES/MEDQ  D:  08/11/2013  T:  08/12/2013  Job:  [id number]

## 2013-08-15 ENCOUNTER — Telehealth: Payer: Self-pay

## 2013-08-15 NOTE — Telephone Encounter (Signed)
Message copied by Jannet AskewHINES, Dola Lunsford E on Fri Aug 15, 2013 11:03 AM ------      Message from: Ricki MillerAMUNDSON DE Gwenevere GhaziARVALHO E SILVA, [name]      Created: Fri Aug 15, 2013  9:23 AM       Please share pathology report with patient.       Her fallopian tubes were normal.      The biopsy failed to confirm endometriosis.  She had findings at the time of surgery showing old endometriosis, so I really think that this is the source of some of her pain.       I will discuss this further with her at her post op visit in about 2 weeks. ------

## 2013-08-15 NOTE — Telephone Encounter (Signed)
Spoke with patient. Advised of message as seen below from Dr.SIlva. Patient agreeable and verbalizes understanding.  Routing to provider for final review. Patient agreeable to disposition. Will close encounter

## 2013-09-01 ENCOUNTER — Encounter: Payer: Self-pay | Admitting: Obstetrics and Gynecology

## 2013-09-01 ENCOUNTER — Ambulatory Visit (INDEPENDENT_AMBULATORY_CARE_PROVIDER_SITE_OTHER): Payer: 59 | Admitting: Obstetrics and Gynecology

## 2013-09-01 VITALS — BP 110/70 | HR 76 | Ht 66.0 in | Wt 165.8 lb

## 2013-09-01 DIAGNOSIS — Z9889 Other specified postprocedural states: Secondary | ICD-10-CM

## 2013-09-01 DIAGNOSIS — R1032 Left lower quadrant pain: Secondary | ICD-10-CM | POA: Insufficient documentation

## 2013-09-01 DIAGNOSIS — R142 Eructation: Secondary | ICD-10-CM

## 2013-09-01 DIAGNOSIS — R141 Gas pain: Secondary | ICD-10-CM

## 2013-09-01 DIAGNOSIS — R140 Abdominal distension (gaseous): Secondary | ICD-10-CM | POA: Insufficient documentation

## 2013-09-01 DIAGNOSIS — R143 Flatulence: Secondary | ICD-10-CM

## 2013-09-01 NOTE — Progress Notes (Signed)
Patient ID: Jane Vaughn, female   DOB: Aug 27, 1980, 33 y.o.   MRN: [id number] GYNECOLOGY  VISIT   HPI: 33 y.o.   Married  Caucasian  female   [phone] with Patient's last menstrual period was 08/13/2013.   here for  3 week follow up.  Status post laparoscopic bilateral salpingectomy with peritoneal biopsy and fulguration of endometriosis. Biopsy was negative for endometriosis.   Painful intercourse, positional.   States that the LLQ pain and bloating is chronic.  Changed the clothing she uses due to this feeling.   In general is very happy to be off OCPs.  Does not know how her menstrual headaches will do off of OCPS.   GYNECOLOGIC HISTORY: Patient's last menstrual period was 08/13/2013. Contraception: Bilateral Salpingectomy Menopausal hormone therapy: n/a        OB History   Grav Para Term Preterm Abortions TAB SAB Ect Mult Living   5    3 1 2   2          There are no active problems to display for this patient.   Past Medical History  Diagnosis Date  . Migraines     menstrual  . SVD (spontaneous vaginal delivery)     x 2  . Anxiety     no med    Past Surgical History  Procedure Laterality Date  . Dilation and curettage of uterus      x2  . Laparoscopic bilateral salpingectomy Bilateral 08/11/2013    Procedure: LAPAROSCOPIC BILATERAL SALPINGECTOMY;  Surgeon: Forrestine HimBrook E Amundson de Gwenevere Ghaziarvalho E Silva, MD;  Location: [facility];  Service: Gynecology;  Laterality: Bilateral;  . Ablation on endometriosis N/A 08/11/2013    Procedure: FULGERATION OF ENDOMETRIOSIS, PERITONEAL BIOPSY;  Surgeon: Forrestine HimBrook E Amundson de Gwenevere Ghaziarvalho E Silva, MD;  Location: [facility];  Service: Gynecology;  Laterality: N/A;    Current Outpatient Prescriptions  Medication Sig Dispense Refill  . diphenhydrAMINE (SOMINEX) 25 MG tablet Take 25 mg by mouth at bedtime as needed for sleep.      Marland Kitchen. ibuprofen (ADVIL,MOTRIN) 800 MG tablet Take 1 tablet (800 mg total) by mouth every 8 (eight) hours as needed.  30  tablet  0  . SUMAtriptan (IMITREX) 100 MG tablet Take 100 mg by mouth every 2 (two) hours as needed for migraine or headache. May repeat in 2 hours if headache persists or recurs.       No current facility-administered medications for this visit.     ALLERGIES: Topamax  Family History  Problem Relation Age of Onset  . Ovarian cancer Maternal Aunt [phone]    deceased age 34  . Breast cancer Maternal Aunt 42  . Diabetes Paternal Uncle   . Diabetes Maternal Grandmother   . Hypertension Mother   . Hypertension Paternal Grandmother   . Ovarian cancer Paternal Grandmother [phone]  . Cancer Paternal Grandmother [phone]    Ovarian  . Breast cancer Maternal Aunt 2643    deceased age 34  . Cancer Maternal Aunt 51    ?endometrial ca  . Leukemia Father   . Stroke Paternal Grandfather     History   Social History  . Marital Status: Married    Spouse Name: N/A    Number of Children: N/A  . Years of Education: N/A   Occupational History  . Not on file.   Social History Main Topics  . Smoking status: Never Smoker   . Smokeless tobacco: Never Used  . Alcohol Use: 1.0 oz/week  2 drink(s) per week     Comment: socially  . Drug Use: No  . Sexual Activity: Yes    Birth Control/ Protection: Surgical   Other Topics Concern  . Not on file   Social History Narrative  . No narrative on file    ROS:  Pertinent items are noted in HPI.  PHYSICAL EXAMINATION:    BP 110/70  Pulse 76  Ht 5\' 6"  (1.676 m)  Wt 165 lb 12.8 oz (75.206 kg)  BMI 26.77 kg/m2  LMP 08/13/2013     General appearance: alert, cooperative and appears stated age Abdomen: incisions intact, soft, non-tender; no masses,  no organomegaly No abnormal inguinal nodes palpated  Pelvic: External genitalia:  no lesions              Urethra:  normal appearing urethra with no masses, tenderness or lesions              Bartholins and Skenes: normal                 Vagina: normal appearing vagina with normal color and discharge, no  lesions              Cervix: normal appearance                   Bimanual Exam:  Uterus:  uterus is normal size, shape, consistency and mildly tender.                                      Adnexa: normal adnexa in size, nontender and no masses                                     ASSESSMENT  Status post laparoscopic bilateral salpingectomy and peritoneal biopsy with fulguration of endometriosis for permanent sterilization.  LLQ pain and bloating.   PLAN  Abstain from intercourse for 2 - 3  more weeks.  Refer to GI - Dr. Loreta Ave.  Return for persistent dyspareunia or LLQ pain.     An After Visit Summary was printed and given to the patient.  _20_____ minutes face to face time of which over 50% was spent in counseling.

## 2013-09-01 NOTE — Patient Instructions (Signed)
You will receive a call about the referral to Dr. Loreta AveMann.

## 2013-09-09 ENCOUNTER — Other Ambulatory Visit: Payer: Self-pay | Admitting: Gastroenterology

## 2013-09-09 DIAGNOSIS — R1032 Left lower quadrant pain: Secondary | ICD-10-CM

## 2013-09-16 ENCOUNTER — Other Ambulatory Visit: Payer: 59

## 2013-11-24 ENCOUNTER — Encounter: Payer: Self-pay | Admitting: Obstetrics and Gynecology

## 2014-07-01 ENCOUNTER — Ambulatory Visit: Payer: BC Managed Care – PPO | Admitting: Obstetrics and Gynecology

## 2014-07-01 ENCOUNTER — Ambulatory Visit: Payer: 59 | Admitting: Obstetrics and Gynecology

## 2014-09-09 ENCOUNTER — Encounter: Payer: Self-pay | Admitting: Obstetrics and Gynecology

## 2014-09-09 ENCOUNTER — Ambulatory Visit (INDEPENDENT_AMBULATORY_CARE_PROVIDER_SITE_OTHER): Payer: BLUE CROSS/BLUE SHIELD | Admitting: Obstetrics and Gynecology

## 2014-09-09 VITALS — BP 100/70 | HR 74 | Resp 14 | Ht 66.0 in | Wt 175.6 lb

## 2014-09-09 DIAGNOSIS — Z0000 Encounter for general adult medical examination without abnormal findings: Secondary | ICD-10-CM

## 2014-09-09 DIAGNOSIS — Z01419 Encounter for gynecological examination (general) (routine) without abnormal findings: Secondary | ICD-10-CM | POA: Diagnosis not present

## 2014-09-09 LAB — CBC
HEMATOCRIT: 38.1 % (ref 36.0–46.0)
HEMOGLOBIN: 12.4 g/dL (ref 12.0–15.0)
MCH: 27.5 pg (ref 26.0–34.0)
MCHC: 32.5 g/dL (ref 30.0–36.0)
MCV: 84.5 fL (ref 78.0–100.0)
MPV: 9.4 fL (ref 8.6–12.4)
Platelets: 225 10*3/uL (ref 150–400)
RBC: 4.51 MIL/uL (ref 3.87–5.11)
RDW: 13.7 % (ref 11.5–15.5)
WBC: 5.9 10*3/uL (ref 4.0–10.5)

## 2014-09-09 LAB — COMPREHENSIVE METABOLIC PANEL
ALBUMIN: 4.1 g/dL (ref 3.6–5.1)
ALT: 20 U/L (ref 6–29)
AST: 21 U/L (ref 10–30)
Alkaline Phosphatase: 69 U/L (ref 33–115)
BUN: 12 mg/dL (ref 7–25)
CHLORIDE: 103 mmol/L (ref 98–110)
CO2: 27 mmol/L (ref 20–31)
Calcium: 9.4 mg/dL (ref 8.6–10.2)
Creat: 0.6 mg/dL (ref 0.50–1.10)
Glucose, Bld: 85 mg/dL (ref 65–99)
Potassium: 4.3 mmol/L (ref 3.5–5.3)
Sodium: 139 mmol/L (ref 135–146)
TOTAL PROTEIN: 6.9 g/dL (ref 6.1–8.1)
Total Bilirubin: 0.4 mg/dL (ref 0.2–1.2)

## 2014-09-09 LAB — LIPID PANEL
Cholesterol: 183 mg/dL (ref 125–200)
HDL: 47 mg/dL (ref >=46)
LDL Cholesterol: 114 mg/dL (ref <130)
TRIGLYCERIDES: 110 mg/dL (ref <150)
Total CHOL/HDL Ratio: 3.9 Ratio (ref <=5.0)
VLDL: 22 mg/dL (ref <30)

## 2014-09-09 NOTE — Progress Notes (Signed)
Patient ID: Jane Vaughn, female   DOB: Mar 08, 1980, 34 y.o.   MRN: [id number] 34 y.o. [id number] Married Caucasian female here for annual exam.    Lost 20 pounds after salpingectomy.  Now has gained.  TFTs normal last year.  Using an app for weight loss.   Feels good being off birth control.  Menses are heavier and cramping.  Pad change every other hour at the heaviest.  Some leakage with cough and sneeze.  Very little.   Vacation plans for [place].  Youngest child is going to [place].   PCP:   Sigmund Hazel  Patient's last menstrual period was 08/27/2014 (exact date).          Sexually active: Yes.    The current method of family planning is tubal ligation.    Exercising: Yes.    walking Smoker:  no  Health Maintenance: Pap:  06-21-12 Neg:Neg HR HPV History of abnormal Pap:  no MMG:  07-02-13 Bil.Diag.d/t breast pain.  Density Cat.C/partially imaged axillary lymph nodes are seen bilaterally.  U/S reveals no evidence of malignancy in either breast. Rec. Screening at age 26:[facility]  Colonoscopy:  n/a BMD:   n/a  Result  n/a TDaP:  2010 Screening Labs: Drawn today Hb today: 12.1, Urine today:unable to void.   reports that she has never smoked. She has never used smokeless tobacco. She reports that she drinks about 1.0 oz of alcohol per week. She reports that she does not use illicit drugs.  Past Medical History  Diagnosis Date  . Migraines     menstrual  . SVD (spontaneous vaginal delivery)     x 2  . Anxiety     no med    Past Surgical History  Procedure Laterality Date  . Dilation and curettage of uterus      x2  . Laparoscopic bilateral salpingectomy Bilateral 08/11/2013    Procedure: LAPAROSCOPIC BILATERAL SALPINGECTOMY;  Surgeon: Forrestine Him Amundson de Gwenevere Ghazi, MD;  Location: [facility];  Service: Gynecology;  Laterality: Bilateral;  . Ablation on endometriosis N/A 08/11/2013    Procedure: FULGERATION OF ENDOMETRIOSIS, PERITONEAL BIOPSY;  Surgeon:  Forrestine Him Amundson de Gwenevere Ghazi, MD;  Location: [facility];  Service: Gynecology;  Laterality: N/A;    Current Outpatient Prescriptions  Medication Sig Dispense Refill  . diphenhydrAMINE (SOMINEX) 25 MG tablet Take 25 mg by mouth at bedtime as needed for sleep.    Marland Kitchen ibuprofen (ADVIL,MOTRIN) 800 MG tablet Take 1 tablet (800 mg total) by mouth every 8 (eight) hours as needed. 30 tablet 0  . SUMAtriptan (IMITREX) 100 MG tablet Take 100 mg by mouth every 2 (two) hours as needed for migraine or headache. May repeat in 2 hours if headache persists or recurs.     No current facility-administered medications for this visit.    Family History  Problem Relation Age of Onset  . Ovarian cancer Maternal Aunt 67    deceased age 98  . Breast cancer Maternal Aunt 42  . Diabetes Paternal Uncle   . Diabetes Maternal Grandmother   . Hypertension Mother   . Hypertension Paternal Grandmother   . Ovarian cancer Paternal Grandmother 104  . Cancer Paternal Grandmother 16    Ovarian  . Breast cancer Maternal Aunt 52    deceased age 72  . Cancer Maternal Aunt 51    ?endometrial ca  . Leukemia Father   . Stroke Paternal Grandfather     ROS:  Pertinent items are noted  in HPI.  Otherwise, a comprehensive ROS was negative.  Exam:   There were no vitals taken for this visit.    General appearance: alert, cooperative and appears stated age Head: Normocephalic, without obvious abnormality, atraumatic Neck: no adenopathy, supple, symmetrical, trachea midline and thyroid normal to inspection and palpation Lungs: clear to auscultation bilaterally Breasts: normal appearance, no masses or tenderness, Inspection negative, No nipple retraction or dimpling, No nipple discharge or bleeding, No axillary or supraclavicular adenopathy Heart: regular rate and rhythm Abdomen: soft, non-tender; bowel sounds normal; no masses,  no organomegaly Extremities: extremities normal, atraumatic, no cyanosis or edema Skin: Skin  color, texture, turgor normal. No rashes or lesions Lymph nodes: Cervical, supraclavicular, and axillary nodes normal. No abnormal inguinal nodes palpated Neurologic: Grossly normal  Pelvic: External genitalia:  no lesions              Urethra:  normal appearing urethra with no masses, tenderness or lesions              Bartholins and Skenes: normal                 Vagina: normal appearing vagina with normal color and discharge, no lesions              Cervix: no lesions              Pap taken: No. Bimanual Exam:  Uterus:  normal size, contour, position, consistency, mobility, non-tender              Adnexa: normal adnexa and no mass, fullness, tenderness              Rectovaginal: No..  Confirms.              Anus:  normal sphincter tone, no lesions  Chaperone was present for exam.  Assessment:   Well woman visit with normal exam. Status post bilateral salpingectomy for contraception.  Weight gain.  Mild GSI.   Plan: Yearly mammogram recommended after age 86.  Recommended self breast exam.  Pap and HR HPV as above. Discussed Calcium, Vitamin D, regular exercise program including cardiovascular and weight bearing exercise. Labs performed.  Yes.  .   See orders. Refills given on medications.  No..  See orders. Patient will do Weight Watchers and begin a concerted exercise regimen.  She will contact office if she desires a nutrition consultation or referral to a weight loss clinic. Discussed GSI and Kegels, PT, or surgery.  Will do Kegels and may consider referral to PT. Follow up annually and prn.      After visit summary provided.

## 2014-09-09 NOTE — Patient Instructions (Signed)
EXERCISE AND DIET:  We recommended that you start or continue a regular exercise program for good health. Regular exercise means any activity that makes your heart beat faster and makes you sweat.  We recommend exercising at least 30 minutes per day at least 3 days a week, preferably 4 or 5.  We also recommend a diet low in fat and sugar.  Inactivity, poor dietary choices and obesity can cause diabetes, heart attack, stroke, and kidney damage, among others.    ALCOHOL AND SMOKING:  Women should limit their alcohol intake to no more than 7 drinks/beers/glasses of wine (combined, not each!) per week. Moderation of alcohol intake to this level decreases your risk of breast cancer and liver damage. And of course, no recreational drugs are part of a healthy lifestyle.  And absolutely no smoking or even second hand smoke. Most people know smoking can cause heart and lung diseases, but did you know it also contributes to weakening of your bones? Aging of your skin?  Yellowing of your teeth and nails?  CALCIUM AND VITAMIN D:  Adequate intake of calcium and Vitamin D are recommended.  The recommendations for exact amounts of these supplements seem to change often, but generally speaking 600 mg of calcium (either carbonate or citrate) and 800 units of Vitamin D per day seems prudent. Certain women may benefit from higher intake of Vitamin D.  If you are among these women, your doctor will have told you during your visit.    PAP SMEARS:  Pap smears, to check for cervical cancer or precancers,  have traditionally been done yearly, although recent scientific advances have shown that most women can have pap smears less often.  However, every woman still should have a physical exam from her gynecologist every year. It will include a breast check, inspection of the vulva and vagina to check for abnormal growths or skin changes, a visual exam of the cervix, and then an exam to evaluate the size and shape of the uterus and  ovaries.  And after 35 years of age, a rectal exam is indicated to check for rectal cancers. We will also provide age appropriate advice regarding health maintenance, like when you should have certain vaccines, screening for sexually transmitted diseases, bone density testing, colonoscopy, mammograms, etc.   MAMMOGRAMS:  All women over 40 years old should have a yearly mammogram. Many facilities now offer a "3D" mammogram, which may cost around $50 extra out of pocket. If possible,  we recommend you accept the option to have the 3D mammogram performed.  It both reduces the number of women who will be called back for extra views which then turn out to be normal, and it is better than the routine mammogram at detecting truly abnormal areas.    COLONOSCOPY:  Colonoscopy to screen for colon cancer is recommended for all women at age 50.  We know, you hate the idea of the prep.  We agree, BUT, having colon cancer and not knowing it is worse!!  Colon cancer so often starts as a polyp that can be seen and removed at colonscopy, which can quite literally save your life!  And if your first colonoscopy is normal and you have no family history of colon cancer, most women don't have to have it again for 10 years.  Once every ten years, you can do something that may end up saving your life, right?  We will be happy to help you get it scheduled when you are ready.    Be sure to check your insurance coverage so you understand how much it will cost.  It may be covered as a preventative service at no cost, but you should check your particular policy.     Exercise to Lose Weight Exercise and a healthy diet may help you lose weight. Your doctor may suggest specific exercises. EXERCISE IDEAS AND TIPS  Choose low-cost things you enjoy doing, such as walking, bicycling, or exercising to workout videos.  Take stairs instead of the elevator.  Walk during your lunch break.  Park your car further away from work or  school.  Go to a gym or an exercise class.  Start with 5 to 10 minutes of exercise each day. Build up to 30 minutes of exercise 4 to 6 days a week.  Wear shoes with good support and comfortable clothes.  Stretch before and after working out.  Work out until you breathe harder and your heart beats faster.  Drink extra water when you exercise.  Do not do so much that you hurt yourself, feel dizzy, or get very short of breath. Exercises that burn about 150 calories:  Running 1  miles in 15 minutes.  Playing volleyball for 45 to 60 minutes.  Washing and waxing a car for 45 to 60 minutes.  Playing touch football for 45 minutes.  Walking 1  miles in 35 minutes.  Pushing a stroller 1  miles in 30 minutes.  Playing basketball for 30 minutes.  Raking leaves for 30 minutes.  Bicycling 5 miles in 30 minutes.  Walking 2 miles in 30 minutes.  Dancing for 30 minutes.  Shoveling snow for 15 minutes.  Swimming laps for 20 minutes.  Walking up stairs for 15 minutes.  Bicycling 4 miles in 15 minutes.  Gardening for 30 to 45 minutes.  Jumping rope for 15 minutes.  Washing windows or floors for 45 to 60 minutes. Document Released: 02/11/2010 Document Revised: 04/03/2011 Document Reviewed: 02/11/2010 [employer] Patient Information 2015 [employer]. This information is not intended to replace advice given to you by your health care provider. Make sure you discuss any questions you have with your health care provider.  

## 2014-09-10 ENCOUNTER — Encounter: Payer: Self-pay | Admitting: Obstetrics and Gynecology

## 2014-09-10 LAB — HEMOGLOBIN, FINGERSTICK: HEMOGLOBIN, FINGERSTICK: 12.1 g/dL (ref 12.0–16.0)

## 2015-05-14 ENCOUNTER — Telehealth: Payer: Self-pay | Admitting: Obstetrics and Gynecology

## 2015-05-14 NOTE — Telephone Encounter (Signed)
LMTCB about canceled appt/rd °

## 2015-09-23 ENCOUNTER — Ambulatory Visit: Payer: BLUE CROSS/BLUE SHIELD | Admitting: Obstetrics and Gynecology

## 2016-01-24 DIAGNOSIS — Z1589 Genetic susceptibility to other disease: Secondary | ICD-10-CM

## 2016-01-24 HISTORY — DX: Genetic susceptibility to other disease: Z15.89

## 2016-05-11 NOTE — Progress Notes (Signed)
36 y.o. [id number] Married Caucasian female here for annual exam.    Cycles have become more irregular, anywhere from 21days to 35 days and states has thick dark blood with clots.  Patient also states 1 week prior to menses has LLQ discomfort each month. 2 menses in November, 2017. This is a nagging pain that lasts for about a week. Has spotting in this time leading up to her cycle. Menses is also quite painful.  Can prevent her from working.   Intolerant of oral contraception due mood swings.  Had Mirena IUD in the past.   Normal pelvic ultrasound July 2015.  Had bilateral salpingectomy, peritoneal biopsy and fulguration of left sided endometriosis.   Menstrual migraines.   Children are doing well.  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  Father in law deceased and family stress due to this.  PCP: Sigmund Hazel, MD     Patient's last menstrual period was 05/06/2016 (exact date).     Period Pattern: (!) Irregular     Sexually active: Yes.   female The current method of family planning is tubal ligation.    Exercising: Yes.    Walking 30-45 minutes 2-3x/week Smoker:  no  Health Maintenance: Pap: 06-21-12 Neg:Neg HR HPV; History of abnormal Pap:  no MMG: 07-02-13 Diag.Bil & Bil US(d/t breast pain)--Density C/Neg/BiRads1:TBC Colonoscopy:  n/a BMD:   n/a  Result  n/a TDaP:  2010 Gardasil:   no HIV:Neg Hep C:pt.thinks was done--Neg Screening Labs:  Hb today: 12.2, Urine today: not done   reports that she has never smoked. She has never used smokeless tobacco. She reports that she drinks about 2.4 oz of alcohol per week . She reports that she does not use drugs.  Past Medical History:  Diagnosis Date  . Anxiety    no med  . Migraines    menstrual  . SVD (spontaneous vaginal delivery)    x 2    Past Surgical History:  Procedure Laterality Date  . ABLATION ON ENDOMETRIOSIS N/A 08/11/2013   Procedure: Elenor Quinones OF ENDOMETRIOSIS, PERITONEAL BIOPSY;  Surgeon: Forrestine Him Amundson de  Gwenevere Ghazi, MD;  Location: [facility];  Service: Gynecology;  Laterality: N/A;  . DILATION AND CURETTAGE OF UTERUS     x2  . LAPAROSCOPIC BILATERAL SALPINGECTOMY Bilateral 08/11/2013   Procedure: LAPAROSCOPIC BILATERAL SALPINGECTOMY;  Surgeon: Forrestine Him Amundson de Gwenevere Ghazi, MD;  Location: [facility];  Service: Gynecology;  Laterality: Bilateral;    Current Outpatient Prescriptions  Medication Sig Dispense Refill  . diphenhydrAMINE (SOMINEX) 25 MG tablet Take 25 mg by mouth at bedtime as needed for sleep.    Marland Kitchen ibuprofen (ADVIL,MOTRIN) 800 MG tablet Take 1 tablet (800 mg total) by mouth every 8 (eight) hours as needed. 30 tablet 0   No current facility-administered medications for this visit.     Family History  Problem Relation Age of Onset  . Hypertension Mother   . Hypertension Paternal Grandmother   . Ovarian cancer Paternal Grandmother 64  . Cancer Paternal Grandmother 52    Ovarian  . Leukemia Father   . Ovarian cancer Maternal Aunt 39    deceased age 8  . Breast cancer Maternal Aunt 42  . Diabetes Paternal Uncle   . Diabetes Maternal Grandmother   . Breast cancer Maternal Aunt 46    deceased age 37  . Cancer Maternal Aunt 51    ?endometrial ca  . Stroke Paternal Grandfather     ROS:  Pertinent items are noted  in HPI.  Otherwise, a comprehensive ROS was negative.  Exam:   BP 118/76 (BP Location: Right Arm, Patient Position: Sitting, Cuff Size: Normal)   Pulse 76   Resp 16   Ht 5' 5.5" (1.664 m)   Wt 179 lb 12.8 oz (81.6 kg)   LMP 05/06/2016 (Exact Date)   BMI 29.47 kg/m     General appearance: alert, cooperative and appears stated age Head: Normocephalic, without obvious abnormality, atraumatic Neck: no adenopathy, supple, symmetrical, trachea midline and thyroid normal to inspection and palpation Lungs: clear to auscultation bilaterally Breasts: normal appearance, no masses or tenderness, No nipple retraction or dimpling, No nipple discharge or bleeding, No  axillary or supraclavicular adenopathy Heart: regular rate and rhythm Abdomen: soft, non-tender; no masses, no organomegaly Extremities: extremities normal, atraumatic, no cyanosis or edema Skin: Skin color, texture, turgor normal. No rashes or lesions Lymph nodes: Cervical, supraclavicular, and axillary nodes normal. No abnormal inguinal nodes palpated Neurologic: Grossly normal  Pelvic: External genitalia:  no lesions              Urethra:  normal appearing urethra with no masses, tenderness or lesions              Bartholins and Skenes: normal                 Vagina: normal appearing vagina with normal color and discharge, no lesions              Cervix: no lesions.  Mild amount of mucousy blood.              Pap taken: Yes.   Bimanual Exam:  Uterus:  normal size, contour, position, consistency, mobility, non-tender              Adnexa: no mass, fullness, tenderness              Rectal exam: Yes.  .  Confirms.              Anus:  normal sphincter tone, no lesions  Chaperone was present for exam.  Assessment:   Well woman visit with normal exam. LLQ pain pre-menses.  Status post bilateral salpingectomy and treatment of endometriosis.  Dysmenorrhea. Menstrual migraines.  FH of breast, uterine, and ovarian cancer.  Plan: Mammogram screening discussed. Recommended self breast awareness. Pap and HR HPV as above. Guidelines for Calcium, Vitamin D, regular exercise program including cardiovascular and weight bearing exercise. Return for pelvic ultrasound.  Will plan for potential return to Mirena IUD.  Can be placed when she is not on her cycle. Routine labs today.  Referred for genetic counseling and potential testing.  Follow up annually and prn.       After visit summary provided.

## 2016-05-12 ENCOUNTER — Encounter: Payer: Self-pay | Admitting: Obstetrics and Gynecology

## 2016-05-12 ENCOUNTER — Ambulatory Visit (INDEPENDENT_AMBULATORY_CARE_PROVIDER_SITE_OTHER): Payer: 59 | Admitting: Obstetrics and Gynecology

## 2016-05-12 VITALS — BP 118/76 | HR 76 | Resp 16 | Ht 65.5 in | Wt 179.8 lb

## 2016-05-12 DIAGNOSIS — R1032 Left lower quadrant pain: Secondary | ICD-10-CM

## 2016-05-12 DIAGNOSIS — Z0000 Encounter for general adult medical examination without abnormal findings: Secondary | ICD-10-CM | POA: Diagnosis not present

## 2016-05-12 DIAGNOSIS — E559 Vitamin D deficiency, unspecified: Secondary | ICD-10-CM | POA: Diagnosis not present

## 2016-05-12 DIAGNOSIS — Z803 Family history of malignant neoplasm of breast: Secondary | ICD-10-CM | POA: Diagnosis not present

## 2016-05-12 DIAGNOSIS — Z01419 Encounter for gynecological examination (general) (routine) without abnormal findings: Secondary | ICD-10-CM | POA: Diagnosis not present

## 2016-05-12 DIAGNOSIS — R7989 Other specified abnormal findings of blood chemistry: Secondary | ICD-10-CM

## 2016-05-12 DIAGNOSIS — Z8041 Family history of malignant neoplasm of ovary: Secondary | ICD-10-CM

## 2016-05-12 LAB — HEMOGLOBIN, FINGERSTICK: Hemoglobin, fingerstick: 12.2 g/dL (ref 12.0–15.0)

## 2016-05-12 LAB — CBC
HEMATOCRIT: 40 % (ref 35.0–45.0)
HEMOGLOBIN: 12.8 g/dL (ref 11.7–15.5)
MCH: 27.4 pg (ref 27.0–33.0)
MCHC: 32 g/dL (ref 32.0–36.0)
MCV: 85.5 fL (ref 80.0–100.0)
MPV: 9.6 fL (ref 7.5–12.5)
Platelets: 244 10*3/uL (ref 140–400)
RBC: 4.68 MIL/uL (ref 3.80–5.10)
RDW: 13.7 % (ref 11.0–15.0)
WBC: 4.7 10*3/uL (ref 3.8–10.8)

## 2016-05-12 LAB — LIPID PANEL
Cholesterol: 168 mg/dL (ref <200)
HDL: 42 mg/dL — ABNORMAL LOW (ref >50)
LDL Cholesterol: 108 mg/dL — ABNORMAL HIGH (ref <100)
TRIGLYCERIDES: 89 mg/dL (ref <150)
Total CHOL/HDL Ratio: 4 Ratio (ref <5.0)
VLDL: 18 mg/dL (ref <30)

## 2016-05-12 LAB — COMPREHENSIVE METABOLIC PANEL
ALK PHOS: 70 U/L (ref 33–115)
ALT: 16 U/L (ref 6–29)
AST: 17 U/L (ref 10–30)
Albumin: 4.5 g/dL (ref 3.6–5.1)
BILIRUBIN TOTAL: 0.3 mg/dL (ref 0.2–1.2)
BUN: 11 mg/dL (ref 7–25)
CALCIUM: 9.6 mg/dL (ref 8.6–10.2)
CO2: 29 mmol/L (ref 20–31)
Chloride: 104 mmol/L (ref 98–110)
Creat: 0.86 mg/dL (ref 0.50–1.10)
Glucose, Bld: 83 mg/dL (ref 65–99)
POTASSIUM: 4.3 mmol/L (ref 3.5–5.3)
Sodium: 139 mmol/L (ref 135–146)
TOTAL PROTEIN: 7 g/dL (ref 6.1–8.1)

## 2016-05-12 NOTE — Patient Instructions (Signed)

## 2016-05-13 LAB — TSH: TSH: 2.33 mIU/L

## 2016-05-13 LAB — VITAMIN D 25 HYDROXY (VIT D DEFICIENCY, FRACTURES): VIT D 25 HYDROXY: 18 ng/mL — AB (ref 30–100)

## 2016-05-14 ENCOUNTER — Encounter: Payer: Self-pay | Admitting: Obstetrics and Gynecology

## 2016-05-14 NOTE — Addendum Note (Signed)
Addended by: Ardell Isaacs, Debbe Bales E on: 05/14/2016 07:47 PM   Modules accepted: Orders

## 2016-05-15 ENCOUNTER — Telehealth: Payer: Self-pay

## 2016-05-15 NOTE — Telephone Encounter (Signed)
-----   Message from Patton Salles, MD sent at 05/14/2016  7:47 PM EDT ----- Please report lab results to patient.  Vit D level is 18.  I am recommending vit D 50,000 IU every other week for 3 months.  Please send to her pharmacy and schedule a lab recheck in 3 months.  I will place a future order.  Her cholesterol panel shows a low HDL cholesterol, which is the good cholesterol, and a slightly elevated LDL cholesterol, which is the bad cholesterol. The ratios of the cholesterol overall are still ok.  These can be improved through vigorous exercise and a low cholesterol diet.  Her blood counts, thyroid, and blood chemistries are all normal.

## 2016-05-15 NOTE — Telephone Encounter (Signed)
Left message to call Jane Vaughn at [phone]. 

## 2016-05-16 ENCOUNTER — Telehealth: Payer: Self-pay | Admitting: Obstetrics and Gynecology

## 2016-05-16 NOTE — Telephone Encounter (Signed)
Patient returned call

## 2016-05-16 NOTE — Telephone Encounter (Signed)
Called patient to review benefits for a recommended ultrasound. Left Voicemail requesting a call back. °

## 2016-05-17 LAB — IPS PAP TEST WITH HPV

## 2016-05-17 MED ORDER — VITAMIN D (ERGOCALCIFEROL) 1.25 MG (50000 UNIT) PO CAPS: ORAL_CAPSULE | ORAL | 0 refills | Status: DC

## 2016-05-17 NOTE — Telephone Encounter (Signed)
Spoke with patient. Advised of results as seen below from Dr.Silva. Patient verbalizes understanding. Rx for Vitamin D 50,000 IU take 1 tablet every other week for 3 months #6 0RF sent to pharmacy on file. 3 month lab recheck scheduled for 08/18/2016 at 8:30 am. Patient would like to schedule PUS as well. Appointment for PUS scheduled for 06/01/2016 at 9:30 am with 10 am consult with Dr.Silva. Patient is agreeable to date and time.  Routing to provider for final review. Patient agreeable to disposition. Will close encounter.

## 2016-05-17 NOTE — Telephone Encounter (Signed)
Jane Vaughn, with our Triage Team, spoke with patient and scheduled appointment on 06/01/16 with Dr Edward Jolly. Information has been forward to provider. Ok to close

## 2016-05-31 ENCOUNTER — Encounter: Payer: Self-pay | Admitting: Genetic Counselor

## 2016-05-31 ENCOUNTER — Telehealth: Payer: Self-pay | Admitting: Genetic Counselor

## 2016-05-31 NOTE — Telephone Encounter (Signed)
Appt has been scheduled for the pt to see Maylon CosKaren Powell on 5/21 at 1pm. Pt aware to arrive 15 minutes early. Demographics verified. Letter mailed to pt

## 2016-06-01 ENCOUNTER — Ambulatory Visit (INDEPENDENT_AMBULATORY_CARE_PROVIDER_SITE_OTHER): Payer: 59

## 2016-06-01 ENCOUNTER — Encounter: Payer: Self-pay | Admitting: Obstetrics and Gynecology

## 2016-06-01 ENCOUNTER — Ambulatory Visit (INDEPENDENT_AMBULATORY_CARE_PROVIDER_SITE_OTHER): Payer: 59 | Admitting: Obstetrics and Gynecology

## 2016-06-01 VITALS — BP 120/76 | HR 80 | Ht 65.5 in | Wt 179.0 lb

## 2016-06-01 DIAGNOSIS — N946 Dysmenorrhea, unspecified: Secondary | ICD-10-CM | POA: Diagnosis not present

## 2016-06-01 DIAGNOSIS — R1032 Left lower quadrant pain: Secondary | ICD-10-CM | POA: Diagnosis not present

## 2016-06-01 NOTE — Progress Notes (Signed)
Encounter reviewed by Dr. Brook Amundson C. Silva.  

## 2016-06-01 NOTE — Progress Notes (Signed)
Patient ID: Jane Vaughn, female   DOB: December 28, 1980, 36 y.o.   MRN: [id number] GYNECOLOGY  VISIT   HPI: 36 y.o.   Married  Caucasian  female   [phone] with Patient's last menstrual period was 05/06/2016 (exact date).   here for pelvic ultrasound for dysmenorrhea.   Menses irregular from 21 - 35 days.  Pain prior to menses.  Uses NSAIDs to control this.  Mood swings with OCPs.  Used Mirena IUD in past and did well with the first one.  The last IUD was complicated by retained placental tissue.   Hx bilateral salpingectomy and tx endometriosis.   Menstrual migraines.   May 21 appt with genetics counselor.   GYNECOLOGIC HISTORY: Patient's last menstrual period was 05/06/2016 (exact date). Contraception:  Tubal Menopausal hormone therapy:  none Last mammogram:  07-02-13 Diag.Bil & Bil US(d/t breast pain)--Density C/Neg/BiRads1:TBC  Last pap smear: 05-12-16 Neg:Neg HR HPV; 06-21-12 Neg:Neg HR HPV        OB History    Gravida Para Term Preterm AB Living   5       3 2    SAB TAB Ectopic Multiple Live Births   2 1               Patient Active Problem List   Diagnosis Date Noted  . LLQ pain 09/01/2013  . Abdominal bloating 09/01/2013    Past Medical History:  Diagnosis Date  . Anxiety    no med  . Low vitamin D level   . Migraines    menstrual  . SVD (spontaneous vaginal delivery)    x 2    Past Surgical History:  Procedure Laterality Date  . ABLATION ON ENDOMETRIOSIS N/A 08/11/2013   Procedure: Elenor Quinones OF ENDOMETRIOSIS, PERITONEAL BIOPSY;  Surgeon: Forrestine Him Amundson de Gwenevere Ghazi, MD;  Location: [facility];  Service: Gynecology;  Laterality: N/A;  . DILATION AND CURETTAGE OF UTERUS     x2  . LAPAROSCOPIC BILATERAL SALPINGECTOMY Bilateral 08/11/2013   Procedure: LAPAROSCOPIC BILATERAL SALPINGECTOMY;  Surgeon: Forrestine Him Amundson de Gwenevere Ghazi, MD;  Location: [facility];  Service: Gynecology;  Laterality: Bilateral;    Current Outpatient Prescriptions  Medication  Sig Dispense Refill  . diphenhydrAMINE (SOMINEX) 25 MG tablet Take 25 mg by mouth at bedtime as needed for sleep.    Marland Kitchen ibuprofen (ADVIL,MOTRIN) 800 MG tablet Take 1 tablet (800 mg total) by mouth every 8 (eight) hours as needed. 30 tablet 0  . Vitamin D, Ergocalciferol, (DRISDOL) 50000 units CAPS capsule Take 1 capsule every other week for 3 months. 6 capsule 0   No current facility-administered medications for this visit.      ALLERGIES: Topamax [topiramate]  Family History  Problem Relation Age of Onset  . Hypertension Mother   . Hypertension Paternal Grandmother   . Ovarian cancer Paternal Grandmother 69  . Cancer Paternal Grandmother 24       Ovarian  . Leukemia Father   . Ovarian cancer Maternal Aunt 20       deceased age 66  . Breast cancer Maternal Aunt 42  . Diabetes Paternal Uncle   . Diabetes Maternal Grandmother   . Breast cancer Maternal Aunt 21       deceased age 57  . Cancer Maternal Aunt 51       ?endometrial ca  . Stroke Paternal Grandfather     Social History   Social History  . Marital status: Married    Spouse name: N/A  .  Number of children: N/A  . Years of education: N/A   Occupational History  . Not on file.   Social History Main Topics  . Smoking status: Never Smoker  . Smokeless tobacco: Never Used  . Alcohol use 2.4 oz/week    2 Glasses of wine, 2 Standard drinks or equivalent per week     Comment: socially  . Drug use: No  . Sexual activity: Yes    Birth control/ protection: Surgical     Comment: Bilateral Salpingectomy   Other Topics Concern  . Not on file   Social History Narrative  . No narrative on file    ROS:  Pertinent items are noted in HPI.  PHYSICAL EXAMINATION:    BP 120/76 (BP Location: Right Arm, Patient Position: Sitting, Cuff Size: Normal)   Pulse 80   Ht 5' 5.5" (1.664 m)   Wt 179 lb (81.2 kg)   LMP 05/06/2016 (Exact Date)   BMI 29.33 kg/m     General appearance: alert, cooperative and appears stated age    Pelvic ultrasound: EMS 7.31 mm.  Uterus without masses.  Right ovary with 15 mm CL cyst.  Left ovary normal.   ASSESSMENT  LLQ pain pre-menses.  Status post bilateral salpingectomy and treatment of endometriosis.  Dysmenorrhea. Menstrual migraines.  FH of breast, uterine, and ovarian cancer.  PLAN  We talked about options for care including Mirena IUD and surgical exploration. She has done well with a Mirena IUD in the past and would like to return to this option.  We reviewed risks and benefits. Office will precert this.  She can have it placed at any time in her menstrual month as she has had bilateral salpingectomy.  I cautioned her about using too much NSAID which can cause gastritis and lead to ulcers. Complete genetics counseling and possible testing.    An After Visit Summary was printed and given to the patient.  __25____ minutes face to face time of which over 50% was spent in counseling.

## 2016-06-08 ENCOUNTER — Encounter: Payer: Self-pay | Admitting: Obstetrics and Gynecology

## 2016-06-08 ENCOUNTER — Telehealth: Payer: Self-pay | Admitting: *Deleted

## 2016-06-08 NOTE — Telephone Encounter (Signed)
Thank you for the update. You may close the encounter. 

## 2016-06-08 NOTE — Telephone Encounter (Signed)
See telephone encounter dated 06/08/16.

## 2016-06-08 NOTE — Telephone Encounter (Signed)
Thomasene LotSuzy or Kriste BasqueBecky, can you update on Mirena IUD benefits? Can schedule at anytime, tubal for contraceptive.    From Victorino DikeJennifer A Ranum To Patton SallesBrook E Amundson C Silva, MD Sent 06/08/2016 9:41 AM  Good morning!   At my last visit, Dr. Edward JollySilva and I discussed the insertion of an IUD. We were awaiting insurance approval. Has the insurance company approved or denied the request? I apologize if you have tried contacting me regarding this matter and have already left a voicemail. I inadvertently deleted a couple of unheard voicemails from my cell phone [phone]) last week and am unsure if one of them was from your office regarding this matter.    Kindest regards,   Juluis PitchJennifer Mccart     Cc: Dr. Edward JollySilva

## 2016-06-08 NOTE — Telephone Encounter (Signed)
Spoke with patient regarding benefit for a Mirena IUD insertion. Patient understood information presented. Patient states she will need to discuss with her spouse before scheduling. Once she has discussed with her spouse, she will call back to advise how she would like to proceed.  Routing to Dr Edward JollySilva  cc: Carmelina DaneJill Hamm  cc: Braxton Feathersebecca Frahm

## 2016-06-12 ENCOUNTER — Ambulatory Visit (HOSPITAL_BASED_OUTPATIENT_CLINIC_OR_DEPARTMENT_OTHER): Payer: 59 | Admitting: Genetic Counselor

## 2016-06-12 ENCOUNTER — Other Ambulatory Visit: Payer: 59

## 2016-06-12 ENCOUNTER — Encounter: Payer: Self-pay | Admitting: Genetic Counselor

## 2016-06-12 DIAGNOSIS — Z8041 Family history of malignant neoplasm of ovary: Secondary | ICD-10-CM | POA: Diagnosis not present

## 2016-06-12 DIAGNOSIS — Z8049 Family history of malignant neoplasm of other genital organs: Secondary | ICD-10-CM | POA: Diagnosis not present

## 2016-06-12 DIAGNOSIS — Z315 Encounter for genetic counseling: Secondary | ICD-10-CM

## 2016-06-12 NOTE — Progress Notes (Signed)
REFERRING PROVIDER: Nunzio Cobbs, MD [address] [place], [place] [address]  PRIMARY PROVIDER:  Kathyrn Lass, MD  PRIMARY REASON FOR VISIT:  1. Family history of uterine cancer   2. Family history of ovarian cancer      HISTORY OF PRESENT ILLNESS:   Jane Vaughn, a 36 y.o. female, was seen for a [place] cancer genetics consultation at the request of Dr. Yisroel Vaughn due to a family history of cancer.  Jane Vaughn presents to clinic today to discuss the possibility of a hereditary predisposition to cancer, genetic testing, and to further clarify her future cancer risks, as well as potential cancer risks for family members. Jane Vaughn is a 36 y.o. female with no personal history of cancer.  She reports having bleeding issues with her periods and that her periods are irregular.  She had a BSO at age 28 due to the family history of ovarian cancer.  CANCER HISTORY:   No history exists.     HORMONAL RISK FACTORS:  Menarche was at age 66-11.  First live birth at age 89.  OCP use for approximately 6-7 years.  Ovaries intact: yes.  Hysterectomy: no.  Menopausal status: premenopausal.  HRT use: 0 years. Colonoscopy: no; not examined. Mammogram within the last year: no. Number of breast biopsies: 0. Up to date with pelvic exams:  yes. Any excessive radiation exposure in the past:  no  Past Medical History:  Diagnosis Date  . Anxiety    no med  . Family history of ovarian cancer   . Family history of uterine cancer   . Low vitamin D level   . Migraines    menstrual  . SVD (spontaneous vaginal delivery)    x 2    Past Surgical History:  Procedure Laterality Date  . ABLATION ON ENDOMETRIOSIS N/A 08/11/2013   Procedure: Almyra Free OF ENDOMETRIOSIS, PERITONEAL BIOPSY;  Surgeon: Everardo All Amundson de Berton Lan, MD;  Location: [facility];  Service: Gynecology;  Laterality: N/A;  . DILATION AND CURETTAGE OF UTERUS     x2  . LAPAROSCOPIC  BILATERAL SALPINGECTOMY Bilateral 08/11/2013   Procedure: LAPAROSCOPIC BILATERAL SALPINGECTOMY;  Surgeon: Everardo All Amundson de Berton Lan, MD;  Location: [facility];  Service: Gynecology;  Laterality: Bilateral;    Social History   Social History  . Marital status: Married    Spouse name: N/A  . Number of children: N/A  . Years of education: N/A   Social History Main Topics  . Smoking status: Never Smoker  . Smokeless tobacco: Never Used  . Alcohol use 2.4 oz/week    2 Glasses of wine, 2 Standard drinks or equivalent per week     Comment: socially  . Drug use: No  . Sexual activity: Yes    Birth control/ protection: Surgical     Comment: Bilateral Salpingectomy   Other Topics Concern  . None   Social History Narrative  . None     FAMILY HISTORY:  We obtained a detailed, 4-generation family history.  Significant diagnoses are listed below: Family History  Problem Relation Age of Onset  . Hypertension Mother   . Hypertension Paternal Grandmother   . Ovarian cancer Paternal Grandmother 91  . Cancer Paternal Grandmother 56       Ovarian  . Leukemia Father   . Ovarian cancer Maternal Aunt 23       deceased age 2  . Breast cancer Maternal Aunt 42  .  Uterine cancer Maternal Aunt        dx in her 43s  . Diabetes Paternal Uncle   . Diabetes Maternal Grandmother   . Breast cancer Maternal Aunt 23       deceased age 66  . Cancer Maternal Aunt 51       ?endometrial ca  . Stroke Paternal Grandfather   . Cancer Maternal Uncle        ocular cancer     The patient has two sons who are cancer free.  She has a brother and sister who are cancer free.  Her mother had a TAH-BSO for bleeding issues when she was in her 52's.  Her father was diagnosed with Hairy Cell leukemia a year ago.  Her father has two sisters and three brothers.  One brother was diagnosed with a pituitary tumor in his 21's.  Her father's mother was diagnosed with ovarian cancer at age 57.  Her mother had tow  brothers and four sisters.  One brother was diagnosed with ocular cancer.  He worked in [employer] during 9/11 and was exposed to everything at ground 0.  Two sisters were diagnosed with uterine cancer in their 71's - one of whom was also diagnosed with breast, ovarian and bone cancer.  The patient's maternal grandfather was diagnosed with lung, brain and bone cancer.   Patient's maternal ancestors are of [place], [place] and [place] descent, and paternal ancestors are of [place] and [place] descent. There is no reported Ashkenazi Jewish ancestry. There is no known consanguinity.  GENETIC COUNSELING ASSESSMENT: Jane Vaughn is a 36 y.o. female with a family history of cancer which is somewhat suggestive of a hereditary cancer syndrome and predisposition to cancer. We, therefore, discussed and recommended the following at today's visit.   DISCUSSION: We discussed that about 20-25% of ovarian cancer is due to hereditary causes, with most cases due to BRCA mutations.  However, some cases can be the result of other hereditary cancer genes, such as BRIP1, RAD51C, RAD51D or the Lynch syndrome genes.  Young uterine cancer raises a suspicion of Lynch syndrome, as well as PTEN mutations.  Ocular cancer could be causes by several things, but the ones we think most about are Retinoblastoma from the RB1 gene and ocular melanoma due to [place] or MITF mutations.  We reviewed the characteristics, features and inheritance patterns of hereditary cancer syndromes. We also discussed genetic testing, including the appropriate family members to test, the process of testing, insurance coverage and turn-around-time for results. We discussed the implications of a negative, positive and/or variant of uncertain significant result. We recommended Jane Vaughn pursue genetic testing for the Multi- gene panel. The Multi-Gene Panel offered by [facility] includes sequencing and/or deletion duplication testing of the following 80 genes: ALK, APC, ATM,  AXIN2,BAP1,  BARD1, BLM, BMPR1A, BRCA1, BRCA2, BRIP1, CASR, CDC73, CDH1, CDK4, CDKN1B, CDKN1C, CDKN2A (p14ARF), CDKN2A (p16INK4a), CEBPA, CHEK2, CTNNA1, DICER1, DIS3L2, EGFR (c.2369C>T, p.Thr790Met variant only), EPCAM (Deletion/duplication testing only), FH, FLCN, GATA2, GPC3, GREM1 (Promoter region deletion/duplication testing only), HOXB13 (c.251G>A, p.Gly84Glu), HRAS, KIT, MAX, MEN1, MET, MITF (c.952G>A, p.Glu318Lys variant only), MLH1, MSH2, MSH3, MSH6, MUTYH, NBN, NF1, NF2, NTHL1, PALB2, PDGFRA, PHOX2B, PMS2, POLD1, POLE, POT1, PRKAR1A, PTCH1, PTEN, RAD50, RAD51C, RAD51D, RB1, RECQL4, RET, RUNX1, SDHAF2, SDHA (sequence changes only), SDHB, SDHC, SDHD, SMAD4, SMARCA4, SMARCB1, SMARCE1, STK11, SUFU, TERT, TERT, TMEM127, TP53, TSC1, TSC2, VHL, WRN and WT1.    Based on Jane Vaughn's family history of cancer, she meets medical criteria for  genetic testing. Despite that she meets criteria, she may still have an out of pocket cost. We discussed that if her out of pocket cost for testing is over $100, the laboratory will call and confirm whether she wants to proceed with testing.  If the out of pocket cost of testing is less than $100 she will be billed by the genetic testing laboratory.   PLAN: After considering the risks, benefits, and limitations, Jane Vaughn  provided informed consent to pursue genetic testing and the blood sample was sent to [facility] for analysis of the multi-gene test. Results should be available within approximately 2-3 weeks' time, at which point they will be disclosed by telephone to Jane Vaughn, as will any additional recommendations warranted by these results. Jane Vaughn will receive a summary of her genetic counseling visit and a copy of her results once available. This information will also be available in Epic. We encouraged Jane Vaughn to remain in contact with cancer genetics annually so that we can continuously update the family history and inform her of any changes  in cancer genetics and testing that may be of benefit for her family. Jane Vaughn questions were answered to her satisfaction today. Our contact information was provided should additional questions or concerns arise.  Lastly, we encouraged Jane Vaughn to remain in contact with cancer genetics annually so that we can continuously update the family history and inform her of any changes in cancer genetics and testing that may be of benefit for this family.   Ms.  Vaughn questions were answered to her satisfaction today. Our contact information was provided should additional questions or concerns arise. Thank you for the referral and allowing [place] to share in the care of your patient.   Laurin Morgenstern P. Florene Glen, [place], [facility] Certified Genetic Counselor [email or web address] phone: [phone]  The patient was seen for a total of 45 minutes in face-to-face genetic counseling.  This patient was discussed with Drs. Magrinat, Lindi Adie and/or Burr Medico who agrees with the above.    _______________________________________________________________________ For Office Staff:  Number of people involved in session: 1 Was an Intern/ student involved with case: no

## 2016-06-21 ENCOUNTER — Telehealth: Payer: Self-pay | Admitting: *Deleted

## 2016-06-21 NOTE — Telephone Encounter (Signed)
[facility] [facility].  Routed to Federal-MogulKaren Powell.

## 2016-06-29 ENCOUNTER — Telehealth: Payer: Self-pay | Admitting: Genetic Counselor

## 2016-06-29 ENCOUNTER — Encounter: Payer: Self-pay | Admitting: Genetic Counselor

## 2016-06-29 DIAGNOSIS — Z1379 Encounter for other screening for genetic and chromosomal anomalies: Secondary | ICD-10-CM | POA: Insufficient documentation

## 2016-06-29 NOTE — Telephone Encounter (Signed)
LM on VM that results were back and to please call back after 12:30.  Left CB instructions.

## 2016-06-30 NOTE — Telephone Encounter (Signed)
LM on VM with CB instructions.  Let her know that her results were back.

## 2016-07-05 ENCOUNTER — Encounter: Payer: Self-pay | Admitting: Genetic Counselor

## 2016-07-05 ENCOUNTER — Ambulatory Visit (HOSPITAL_BASED_OUTPATIENT_CLINIC_OR_DEPARTMENT_OTHER): Payer: 59 | Admitting: Genetic Counselor

## 2016-07-05 DIAGNOSIS — Z808 Family history of malignant neoplasm of other organs or systems: Secondary | ICD-10-CM | POA: Diagnosis not present

## 2016-07-05 DIAGNOSIS — Z1379 Encounter for other screening for genetic and chromosomal anomalies: Secondary | ICD-10-CM | POA: Diagnosis not present

## 2016-07-05 DIAGNOSIS — Z806 Family history of leukemia: Secondary | ICD-10-CM | POA: Diagnosis not present

## 2016-07-05 DIAGNOSIS — Z8041 Family history of malignant neoplasm of ovary: Secondary | ICD-10-CM | POA: Diagnosis not present

## 2016-07-05 DIAGNOSIS — Z8049 Family history of malignant neoplasm of other genital organs: Secondary | ICD-10-CM

## 2016-07-05 DIAGNOSIS — Z853 Personal history of malignant neoplasm of breast: Secondary | ICD-10-CM | POA: Diagnosis not present

## 2016-07-05 DIAGNOSIS — Z315 Encounter for genetic counseling: Secondary | ICD-10-CM | POA: Diagnosis not present

## 2016-07-05 NOTE — Progress Notes (Signed)
GENETIC TEST RESULTS   Patient Name: Jane Vaughn Patient Age: 36 y.o. Encounter Date: 07/05/2016  Referring Provider: Conley Simmonds, MD    Jane Vaughn was seen in the Cancer Genetics clinic on July 05, 2016 due to a family history of cancer and concern regarding a hereditary predisposition to cancer in the family. Please refer to the prior Genetics clinic note for more information regarding Jane Vaughn's medical and family histories and our assessment at the time.   FAMILY HISTORY:  We obtained a detailed, 4-generation family history.  Significant diagnoses are listed below: Family History  Problem Relation Age of Onset  . Hypertension Mother   . Hypertension Paternal Grandmother   . Ovarian cancer Paternal Grandmother 70  . Cancer Paternal Grandmother 23       Ovarian  . Leukemia Father   . Ovarian cancer Maternal Aunt 54       deceased age 58  . Breast cancer Maternal Aunt 42  . Uterine cancer Maternal Aunt        dx in her 7s  . Diabetes Paternal Uncle   . Diabetes Maternal Grandmother   . Breast cancer Maternal Aunt 76       deceased age 13  . Cancer Maternal Aunt 51       ?endometrial ca  . Stroke Paternal Grandfather   . Cancer Maternal Uncle        ocular cancer - melanoma    The patient has two sons who are cancer free.  She has a brother and sister who are cancer free.  Her mother had a TAH-BSO for bleeding issues when she was in her 73's.  Her father was diagnosed with Hairy Cell leukemia a year ago.  Her father has two sisters and three brothers.  One brother was diagnosed with a pituitary tumor in his 48's.  Her father's mother was diagnosed with ovarian cancer at age 35.  Her mother had tow brothers and four sisters.  One brother was diagnosed with ocular cancer, which she recently learned was a melanoma.  He worked in [employer] during 9/11 and was exposed to everything at ground 0.  Two sisters were diagnosed with uterine cancer in their 64's - one of whom was  also diagnosed with breast, ovarian and bone cancer.  The patient's maternal grandfather was diagnosed with lung, brain and bone cancer.   Patient's maternal ancestors are of [place], [place] and [place] descent, and paternal ancestors are of [place] descent. There is no reported Ashkenazi Jewish ancestry. There is no known consanguinity.  GENETIC TESTING:  At the time of Jane Vaughn's visit, we recommended she pursue genetic testing of the Multi-gene cancer panel genes. This test, which included sequencing and deletion/duplication analysis of 83 genes, was performed at [facility].   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 This gene mutation increases the risk to develop melanoma and kidney cancer.    The MITF gene provides instructions for making a protein called microphthalmia-associated transcription factor. This protein plays a role in the development, survival, and function of certain types of cells. To carry out this role, the protein attaches to specific areas of DNA and helps control the activity of particular genes. On the basis of this action, the protein is called a transcription factor.  The MITF protein helps control the development and function of pigment-producing cells called melanocytes. Within these cells, this protein also controls production of the pigment melanin, which contributes to  hair, eye, and skin color. Melanocytes are also found in the inner ear and play an important role in hearing. Additionally, the MITF protein regulates the development of specialized cells in the eye called retinal pigment epithelial cells. These cells nourish the retina, the part of the eye that detects light and color.   The MITF gene is considered a moderate risk gene for malignant melanoma. Although the exact lifetime risk for melanoma has not yet been established, it has been estimated to be approximately 2.5%.  It is estimated  that individuals with a MITF gene mutation have at least a 5-fold risk (over that of the general population risk) to develop melanoma and kidney cancer. This risk can be managed with appropriate screening.   There are currently no published medical management guidelines for individuals at increased cancer risk due to a MITF mutation. We expect that medical management guidelines for the MITF gene may become available over time, thus we encouraged Jane Vaughn to contact us regularly for any updates.   Given the increased melanoma risk, studies suggest that it is reasonable to consider regular skin cancer screening. Of note, the article by Bartholomew CrewsLeachman et al. (2017) contains considerations for medical management, including sun protection, monthly self skin exams, and dermatologic surveillance.  Once under the care of a dermatologist, they can recommend the frequency of skin examinations, most commonly annually, but sometimes more frequent. The following are general medical recommendations for individuals who have mutations in the MITF gene.     Screening and Management Options: We discussed various risk management and reduction choices for individuals in high-risk categories, including increased screening, preventive surgeries and chemoprevention.    Regarding kidney cancer risk management: We suggest at least annual imaging (as determined by your Urologist) for kidney tumors and regular clinical exams with a Urologist.  We discussed that many times imaging of the kidneys can find unexpected findings, many times that are benign.  Therefore, if renal screening commences patients should expect that any findings will need to be followed up upon and tracked.  Jane Vaughn does not have a Urologist that she sees regularly.  She may get a referral from her referring or primary care physician.   Regarding melanoma risk management: Annual dermatology examination and prompt evaluation for any abnormal skin lesions  beginning now. We suggest avoidance of sunburn and tanning. Jane Vaughn has seen Dr. Nita SellsJohn Hall in the past for Dermatology.  She will make a follow up appointment with him.  Annual Ophthalmologic/retinal exams for the infrequent occurrence of retinal melanoma.  Jane Vaughn has an Optometrist that she sees and will consult with them on their comfort level of screening for ocular melanoma.  FAMILY MEMBERS: It is important that all of Ms. Eliot's relatives (both men and women) know of the presence of this gene mutation. Site-specific genetic testing can sort out who in the family is at risk and who is not.   Ms. Sharol RousselGlosson's children are have a 50% chance to have inherited this mutation. However, they are relatively young and this will not be of any consequence to them for several years. We do not test children because there is no risk to them until they are adults. It is unclear on how early children should be tested for this mutation.  However, the [facility] paper suggested that Dermatology appointments should be started around puberty.    Ms. Sharol RousselGlosson's parents and siblings have a 50% chance to have inherited this mutation. We recommend they have genetic testing  for this same mutation, as identifying the presence of this mutation would allow them to also take advantage of risk-reducing measures. [facility] will provide free genetic testing to Ms. Landau's first degree relatives if testing is performed within 90 days of her test report.  We provided information on how to contact genetic testing sites for her relatives.  Summary: The information we discussed at the time of Ms. Tilson's appointment and which is summarized in this letter is what is known as of this date. With the rapid pace of medical research, new discoveries may modify our assessment and approach to her family in the future. We, therefore, encourage Ms. Washabaugh's to re-contact us periodically for information on advances in the field. Ms.  Mcenroe may also call us with additional questions or updates regarding her personal and family cancer history.  Our contact number was provided. Ms. Reine questions were answered to her satisfaction today, and she knows she is welcome to call anytime with additional questions.   Karen P. Lowell Guitar, MS, [facility] Certified Genetic Counselor [email or web address] phone: [phone]

## 2016-08-02 ENCOUNTER — Other Ambulatory Visit: Payer: Self-pay | Admitting: Obstetrics and Gynecology

## 2016-08-02 NOTE — Telephone Encounter (Signed)
Ok for appointment on 7/27 for vit D recheck.  No refill at this time.

## 2016-08-02 NOTE — Telephone Encounter (Signed)
[facility]Scribe request from [facility] for refill on VITAMIN D Last filled - 05/17/16, #6 X 0 RF Last AEX - 05/12/16 Next Vitamin D check is scheduled for 08/18/16.  Message left for patient to return call. Patient should have taken 6th tablet around 07/26/16. Please advise refill or if patient should come prior to 08/18/16 for Vitamin D check.

## 2016-08-15 ENCOUNTER — Encounter: Payer: Self-pay | Admitting: Obstetrics and Gynecology

## 2016-08-18 ENCOUNTER — Telehealth: Payer: Self-pay | Admitting: Obstetrics and Gynecology

## 2016-08-18 ENCOUNTER — Other Ambulatory Visit: Payer: Self-pay | Admitting: Obstetrics and Gynecology

## 2016-08-18 ENCOUNTER — Other Ambulatory Visit (INDEPENDENT_AMBULATORY_CARE_PROVIDER_SITE_OTHER): Payer: 59

## 2016-08-18 DIAGNOSIS — E559 Vitamin D deficiency, unspecified: Secondary | ICD-10-CM

## 2016-08-18 DIAGNOSIS — R7989 Other specified abnormal findings of blood chemistry: Secondary | ICD-10-CM

## 2016-08-18 NOTE — Telephone Encounter (Signed)
Patient wants to schedule an appointment for an IUD. Please see e mail request sent on 08/15/16

## 2016-08-18 NOTE — Telephone Encounter (Signed)
Left message to call Asbury Hair at [phone].  Patient has had a bilateral salpingectomy and can schedule at any time during her cycle.

## 2016-08-19 LAB — VITAMIN D 25 HYDROXY (VIT D DEFICIENCY, FRACTURES): Vit D, 25-Hydroxy: 21.6 ng/mL — ABNORMAL LOW (ref 30.0–100.0)

## 2016-08-22 NOTE — Telephone Encounter (Signed)
Received transfer call from Triage nurse, Carmelina DaneJill Hamm, RN. Spoke with patient and reviewed for an IUD insertion. Patient understood and agreeable. Patient ready to schedule. Patient scheduled 10/09/16 with Dr Edward JollySilva. Patient aware of date, arrival time and cancellation policy. Patient had no further questions.   Routing to Dr Edward JollySilva for final review  cc: Carmelina DaneJill Hamm, RN

## 2016-08-22 NOTE — Telephone Encounter (Signed)
Spoke with patient. Would like to review benefits of IUD again prior to scheduling. Contraceptive is tubal ligation, advised can schedule anytime during cycle. Ad[facility] insurance and benefits department for review of benefits. Patient is agreeable.  [facility]

## 2016-08-22 NOTE — Telephone Encounter (Signed)
Thank you for the update.  I closed the encounter.  Cc- Carmelina DaneJill Hamm

## 2016-08-22 NOTE — Telephone Encounter (Signed)
Patient returning a call and can be reached at her work number.

## 2016-08-23 MED ORDER — VITAMIN D (ERGOCALCIFEROL) 1.25 MG (50000 UNIT) PO CAPS: ORAL_CAPSULE | ORAL | 0 refills | Status: DC

## 2016-08-23 NOTE — Addendum Note (Signed)
Addended by: Ardell IsaacsAMUNDSON C SILVA, Debbe BalesBROOK E on: 08/23/2016 07:16 PM   Modules accepted: Orders

## 2016-09-26 ENCOUNTER — Encounter: Payer: Self-pay | Admitting: Obstetrics and Gynecology

## 2016-09-27 ENCOUNTER — Telehealth: Payer: Self-pay | Admitting: *Deleted

## 2016-09-27 NOTE — Telephone Encounter (Signed)
See telephone encounter dated 09/27/16.

## 2016-09-27 NOTE — Telephone Encounter (Signed)
Spoke with patient in regards to MyChart message as seen below. Recommended OV to discuss further with provider prior to IUD insertion on 10/09/16 with Dr. Edward JollySilva. Advised patient Dr. Edward JollySilva is out of the office, will return on 9/17, can schedule with covering provider. Patient scheduled for 09/28/16 at 1:15pm with Dr. Oscar LaJertson. Patient is agreeable to date and time. Advised patient should Dr. Oscar LaJertson have any additional recommendations, will return call.  Routing to provider for final review. Patient is agreeable to disposition. Will close encounter.  Cc: Dr. Edward JollySilva     From Jane DikeJennifer A Vaughn To Jane SallesBrook E Amundson C Silva, MD Sent 09/26/2016 4:26 PM  Dr. Edward JollySilva,   I have an upcoming appointment for the IUD insertion, but am wondering if I need to speak with you either by phone or in-person prior to the appointment. I've been researching some of my symptoms and other issues and am wondering if I'm not also having symptoms of PMDD? The irritability, lack of control, and all over the place mood swings are terrible each month. These, along with other symptoms/issues, occur each month within about a week of the start of my period and last until about 3 or 4 days into my period. I'll then be fine again until the next month (or whenever my period decides to happen).    I also have the low Vitamin D, which I know can contribute to some issues, such as lack of concentration, but am wondering if some of my issue is linked to something such as PMDD? I am happy to discuss further. I'm hoping for a good experience with the IUD this time, and really do hope it helps to control many of the issues you and I have discussed.   Thanks,  DIRECTVJennifer

## 2016-09-28 ENCOUNTER — Encounter: Payer: Self-pay | Admitting: Obstetrics and Gynecology

## 2016-09-28 ENCOUNTER — Ambulatory Visit (INDEPENDENT_AMBULATORY_CARE_PROVIDER_SITE_OTHER): Payer: 59 | Admitting: Obstetrics and Gynecology

## 2016-09-28 VITALS — BP 112/70 | HR 80 | Resp 14 | Wt 174.0 lb

## 2016-09-28 DIAGNOSIS — Z8742 Personal history of other diseases of the female genital tract: Secondary | ICD-10-CM

## 2016-09-28 DIAGNOSIS — N946 Dysmenorrhea, unspecified: Secondary | ICD-10-CM | POA: Diagnosis not present

## 2016-09-28 DIAGNOSIS — F3281 Premenstrual dysphoric disorder: Secondary | ICD-10-CM

## 2016-09-28 MED ORDER — CITALOPRAM HYDROBROMIDE 20 MG PO TABS: ORAL_TABLET | ORAL | 1 refills | Status: DC

## 2016-09-28 NOTE — Progress Notes (Signed)
GYNECOLOGY  VISIT   HPI: 36 y.o.   Married  Caucasian  female   [phone] with Patient's last menstrual period was 09/18/2016.   here for consult to discuss concerns for IUD insertion. She has a h/o endometriosis, dysmenorrhea. The week or so prior to her cycles she is having really bad mood changes, depression. 4 days into her cycle she feels great. Cycles are irregular can be every 1-1.5 months. The mood changes are always prior to her cycle and are getting worse. Short tempered during that time.  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 She was on Zoloft for the PP depression.  She has tried OCP's in the past, she has had mood changes every time. The older she gets, the worse her mood changes get.  She has a vit d def.     GYNECOLOGIC HISTORY: Patient's last menstrual period was 09/18/2016. Contraception:Tubal ligation  Menopausal hormone therapy: none         OB History    Gravida Para Term Preterm AB Living   5       3 2    SAB TAB Ectopic Multiple Live Births   2 1               Patient Active Problem List   Diagnosis Date Noted  . Genetic testing 06/29/2016  . Family history of uterine cancer   . Family history of ovarian cancer   . LLQ pain 09/01/2013  . Abdominal bloating 09/01/2013    Past Medical History:  Diagnosis Date  . Anxiety    no med  . Family history of ovarian cancer   . Family history of uterine cancer   . 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 Low vitamin D level   . Migraines    menstrual  . SVD (spontaneous vaginal delivery)    x 2    Past Surgical History:  Procedure Laterality Date  . ABLATION ON ENDOMETRIOSIS N/A 08/11/2013   Procedure: Elenor QuinonesFULGERATION OF ENDOMETRIOSIS, PERITONEAL BIOPSY;  Surgeon: Forrestine HimBrook E Amundson de Gwenevere Ghaziarvalho E Silva, MD;  Location: [facility];  Service: Gynecology;  Laterality: N/A;  . DILATION AND CURETTAGE OF UTERUS     x2  . LAPAROSCOPIC BILATERAL SALPINGECTOMY Bilateral  08/11/2013   Procedure: LAPAROSCOPIC BILATERAL SALPINGECTOMY;  Surgeon: Forrestine HimBrook E Amundson de Gwenevere Ghaziarvalho E Silva, MD;  Location: [facility];  Service: Gynecology;  Laterality: Bilateral;    Current Outpatient Prescriptions  Medication Sig Dispense Refill  . diphenhydrAMINE (SOMINEX) 25 MG tablet Take 25 mg by mouth at bedtime as needed for sleep.    Marland Kitchen. ibuprofen (ADVIL,MOTRIN) 800 MG tablet Take 1 tablet (800 mg total) by mouth every 8 (eight) hours as needed. 30 tablet 0  . Vitamin D, Ergocalciferol, (DRISDOL) 50000 units CAPS capsule Take 1 capsule every week for 3 months. 12 capsule 0  . citalopram (CELEXA) 20 MG tablet Take 1/2 a tablet po qd for one week, if tolerating then increase to one tablet a day 30 tablet 1   No current facility-administered medications for this visit.      ALLERGIES: Topamax [topiramate]  Family History  Problem Relation Age of Onset  . Hypertension Mother   . Hypertension Paternal Grandmother   . Ovarian cancer Paternal Grandmother [phone]  . Cancer Paternal Grandmother [phone]       Ovarian  . Leukemia Father   . Ovarian cancer Maternal Aunt [phone]  deceased age 61  . Breast cancer Maternal Aunt 42  . Uterine cancer Maternal Aunt        dx in her 12s  . Diabetes Paternal Uncle   . Diabetes Maternal Grandmother   . Breast cancer Maternal Aunt 60       deceased age 75  . Cancer Maternal Aunt 51       ?endometrial ca  . Stroke Paternal Grandfather   . Cancer Maternal Uncle        ocular cancer - melanoma    Social History   Social History  . Marital status: Married    Spouse name: N/A  . Number of children: N/A  . Years of education: N/A   Occupational History  . Not on file.   Social History Main Topics  . Smoking status: Never Smoker  . Smokeless tobacco: Never Used  . Alcohol use 2.4 oz/week    2 Glasses of wine, 2 Standard drinks or equivalent per week     Comment: socially  . Drug use: No  . Sexual activity: Yes    Birth control/ protection:  Surgical     Comment: Bilateral Salpingectomy   Other Topics Concern  . Not on file   Social History Narrative  . No narrative on file    Review of Systems  Constitutional: Negative.   HENT: Negative.   Eyes: Negative.   Respiratory: Negative.   Cardiovascular: Negative.   Gastrointestinal: Negative.   Genitourinary: Negative.   Musculoskeletal: Negative.   Skin: Negative.   Neurological: Negative.   Endo/Heme/Allergies: Negative.   Psychiatric/Behavioral: Negative.     PHYSICAL EXAMINATION:    BP 112/70 (BP Location: Right Arm, Patient Position: Sitting, Cuff Size: Normal)   Pulse 80   Resp 14   Wt 174 lb (78.9 kg)   LMP 09/18/2016   BMI 28.51 kg/m     General appearance: alert, cooperative and appears stated age   ASSESSMENT PMDD She has a h/o endometriosis and dysmenorrhea. She is scheduled to return for IUD insertion later this month, but would like to get her PMDD prior to having an IUD inserted    PLAN Hasn't tolerated OCP's in the past Recommended starting a SSRI, she is agreeable.  Will start Celexa, discussed side effects F/U with Dr Edward Jolly in one month She wants to cancel the IUD insertion for now.   An After Visit Summary was printed and given to the patient.  15 minutes face to face time of which over 50% was spent in counseling.   CC: Dr Edward Jolly

## 2016-10-09 ENCOUNTER — Ambulatory Visit: Payer: 59 | Admitting: Obstetrics and Gynecology

## 2016-11-02 ENCOUNTER — Ambulatory Visit: Payer: 59 | Admitting: Obstetrics and Gynecology

## 2016-11-06 ENCOUNTER — Encounter: Payer: Self-pay | Admitting: Obstetrics and Gynecology

## 2016-11-06 ENCOUNTER — Ambulatory Visit (INDEPENDENT_AMBULATORY_CARE_PROVIDER_SITE_OTHER): Payer: 59 | Admitting: Obstetrics and Gynecology

## 2016-11-06 VITALS — BP 116/62 | HR 64 | Resp 16 | Wt 172.0 lb

## 2016-11-06 DIAGNOSIS — F3281 Premenstrual dysphoric disorder: Secondary | ICD-10-CM

## 2016-11-06 MED ORDER — CITALOPRAM HYDROBROMIDE 20 MG PO TABS: ORAL_TABLET | ORAL | 6 refills | Status: DC

## 2016-11-06 NOTE — Progress Notes (Signed)
GYNECOLOGY  VISIT   HPI: 36 y.o.   Married  Caucasian  female   [phone] with Patient's last menstrual period was 10/12/2016.   here for 1 month f/u after starting Celexa.    Has premenstrual symptoms the week prior to her cycle.  Had a lot of anger during this period of time.  Was even doing marriage counseling.  When her cycle came on, she felt better again.   Had a cycle since starting Celexa and symptoms are improved and managed. Taking Celexa 20 mg daily.   Appetite is decreased. Has decreased orgasm response.  This is a new issue for her.  Has some fogginess in her head also.   Due for IUD insertion but wants to hold off on this.  Has dysmenorrhea and LLQ pain.  Normal pelvic ultrasound.   Used Zoloft in the past for PP depression.  Did well with that.   GYNECOLOGIC HISTORY: Patient's last menstrual period was 10/12/2016. Contraception: Fallopian tubes removed Menopausal hormone therapy:  none Last mammogram:  n/a Last pap smear:   05/12/16 pap and HR HPV negative        OB History    Gravida Para Term Preterm AB Living   SAB TAB Ectopic Multiple Live Births   2 1               Patient Active Problem List   Diagnosis Date Noted  . Genetic testing 06/29/2016  . Family history of uterine cancer   . Family history of ovarian cancer   . LLQ pain 09/01/2013  . Abdominal bloating 09/01/2013    Past Medical History:  Diagnosis Date  . Anxiety    no med  . Family history of ovarian cancer   . Family history of uterine cancer   . 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 Low vitamin D level   . Migraines    menstrual  . SVD (spontaneous vaginal delivery)    x 2    Past Surgical History:  Procedure Laterality Date  . ABLATION ON ENDOMETRIOSIS N/A 08/11/2013   Procedure: Elenor Quinones OF ENDOMETRIOSIS, PERITONEAL BIOPSY;  Surgeon: Forrestine Him Amundson de Gwenevere Ghazi, MD;  Location: [facility];  Service:  Gynecology;  Laterality: N/A;  . DILATION AND CURETTAGE OF UTERUS     x2  . LAPAROSCOPIC BILATERAL SALPINGECTOMY Bilateral 08/11/2013   Procedure: LAPAROSCOPIC BILATERAL SALPINGECTOMY;  Surgeon: Forrestine Him Amundson de Gwenevere Ghazi, MD;  Location: [facility];  Service: Gynecology;  Laterality: Bilateral;    Current Outpatient Prescriptions  Medication Sig Dispense Refill  . citalopram (CELEXA) 20 MG tablet Take 1/2 a tablet po qd for one week, if tolerating then increase to one tablet a day 30 tablet 1  . diphenhydrAMINE (SOMINEX) 25 MG tablet Take 25 mg by mouth at bedtime as needed for sleep.    Marland Kitchen ibuprofen (ADVIL,MOTRIN) 800 MG tablet Take 1 tablet (800 mg total) by mouth every 8 (eight) hours as needed. 30 tablet 0  . Vitamin D, Ergocalciferol, (DRISDOL) 50000 units CAPS capsule Take 1 capsule every week for 3 months. 12 capsule 0   No current facility-administered medications for this visit.      ALLERGIES: Topamax [topiramate]  Family History  Problem Relation Age of Onset  . Hypertension Mother   . Hypertension Paternal Grandmother   . Ovarian cancer Paternal Grandmother 51  .  Cancer Paternal Grandmother 32       Ovarian  . Leukemia Father   . Ovarian cancer Maternal Aunt 4       deceased age 30  . Breast cancer Maternal Aunt 42  . Uterine cancer Maternal Aunt        dx in her 42s  . Diabetes Paternal Uncle   . Diabetes Maternal Grandmother   . Breast cancer Maternal Aunt 88       deceased age 21  . Cancer Maternal Aunt 51       ?endometrial ca  . Stroke Paternal Grandfather   . Cancer Maternal Uncle        ocular cancer - melanoma    Social History   Social History  . Marital status: Married    Spouse name: N/A  . Number of children: N/A  . Years of education: N/A   Occupational History  . Not on file.   Social History Main Topics  . Smoking status: Never Smoker  . Smokeless tobacco: Never Used  . Alcohol use 2.4 oz/week    2 Glasses of wine, 2 Standard  drinks or equivalent per week     Comment: socially  . Drug use: No  . Sexual activity: Yes    Birth control/ protection: Surgical     Comment: Bilateral Salpingectomy   Other Topics Concern  . Not on file   Social History Narrative  . No narrative on file    ROS:  Pertinent items are noted in HPI.  PHYSICAL EXAMINATION:    BP 116/62 (BP Location: Right Arm, Patient Position: Sitting, Cuff Size: Normal)   Pulse 64   Resp 16   Wt 172 lb (78 kg)   LMP 10/12/2016   BMI 28.19 kg/m     General appearance: alert, cooperative and appears stated age   Chaperone was present for exam.  ASSESSMENT  PMDD.  On Celexa.  Dysmenorrhea and left sided pelvic pain.   PLAN  She will stop the Celexa 20 mg during her PMDD 2 weeks only.  If she needs to continue the Celexa, we will do this for a total of at least 3 months.  I gave her refills until may annual exam. If this is not successful, we can go back to Zoloft.  She will contact me back to let me know how she is doing.  Will hold on IUD at this time.    An After Visit Summary was printed and given to the patient.  __15____ minutes face to face time of which over 50% was spent in counseling.

## 2016-11-08 ENCOUNTER — Other Ambulatory Visit: Payer: Self-pay | Admitting: Obstetrics and Gynecology

## 2016-11-08 NOTE — Telephone Encounter (Signed)
Medication refill request: Vitamin D Last AEX:  05/12/16 Dr. Edward JollySilva  Next AEX: 05/23/17 Dr. Edward JollySilva  Last MMG (if hormonal medication request):  Refill authorized: 08/23/16 #[id number].

## 2016-11-21 ENCOUNTER — Other Ambulatory Visit: Payer: Self-pay | Admitting: Obstetrics and Gynecology

## 2016-11-21 NOTE — Telephone Encounter (Signed)
Medication refill request: celexa  Last AEX:  05/12/16  Next AEX: 05/23/17  Last MMG (if hormonal medication request): none Refill authorized: 11/06/16 #[id number]. [facility] [place]

## 2016-12-05 ENCOUNTER — Encounter: Payer: Self-pay | Admitting: Obstetrics and Gynecology

## 2016-12-05 ENCOUNTER — Telehealth: Payer: Self-pay | Admitting: *Deleted

## 2016-12-05 NOTE — Telephone Encounter (Signed)
Call to patient. See telephone encounter.

## 2016-12-05 NOTE — Telephone Encounter (Signed)
Patient returned call. Patient scheduled for vitamin d recheck on Wednesday 12/06/16 at 0900. Patient agreeable to date and time of appointment. Future order present for blood work. RN advised patient that [facility] [address] [facility] [address] [facility]. [facility]. RN advised patient to return call to office is she has any issues getting it filled. Patient agreeable. Patient aware RN will send MyChart message/telephone call to Dr. Edward JollySilva for review of update about taking Citalopram. Patient states the way Dr. Edward JollySilva suggested she take it has been "perfect and so far, so good."   Routing to provider for final review. Patient agreeable to disposition. Will close encounter.

## 2016-12-05 NOTE — Telephone Encounter (Signed)
Message left to return call to Jane Vaughn at [phone].    

## 2016-12-05 NOTE — Telephone Encounter (Signed)
Visit Follow-Up Question  Message [id number]  From Juluis PitchGlosson, Aunya A To [name], [name], MD Sent 12/05/2016 8:55 AM  Dr. Edward JollySilva,   I have a quick question for you, as well as some feedback.   1. Will we need to do a Vitamin D recheck? I finished the three month course of a capsule once per week back in October. I think we had said a recheck of my levels would need to be done and, if so, when?   2. The new method of taking Citalopram during the two week period each month seems to be working. I'll know more as I just started it again for two weeks, but there was no real adjustment coming off of it and the sexual side effects I was experiencing while on the medication disappeared when I came off of it. I am hopeful this new way of taking the medication will be the route to take. So far, so good.  (Also, just checking, but did the new Citalopram [facility] that you issued get [facility]? I am now about out of the prescription from Dr. Shela CommonsJ and will need the one you wrote to begin - let me know if you need me to call [facility] to check on this).   Thanks,   DIRECTVJennifer

## 2016-12-06 ENCOUNTER — Other Ambulatory Visit (INDEPENDENT_AMBULATORY_CARE_PROVIDER_SITE_OTHER): Payer: 59

## 2016-12-06 DIAGNOSIS — R7889 Finding of other specified substances, not normally found in blood: Secondary | ICD-10-CM

## 2016-12-06 DIAGNOSIS — R7989 Other specified abnormal findings of blood chemistry: Secondary | ICD-10-CM

## 2016-12-07 ENCOUNTER — Telehealth: Payer: Self-pay | Admitting: *Deleted

## 2016-12-07 LAB — VITAMIN D 25 HYDROXY (VIT D DEFICIENCY, FRACTURES): Vit D, 25-Hydroxy: 24.1 ng/mL — ABNORMAL LOW (ref 30.0–100.0)

## 2016-12-07 MED ORDER — VITAMIN D (ERGOCALCIFEROL) 1.25 MG (50000 UNIT) PO CAPS: 50000.0000 [IU] | ORAL_CAPSULE | ORAL | 0 refills | Status: DC

## 2016-12-07 NOTE — Telephone Encounter (Signed)
Notes recorded by Leda MinHamm, Rheanne Cortopassi N, RN on 12/07/2016 at 11:21 AM EST Left message to call Noreene LarssonJill at [phone]. See telephone encounter dated 12/07/16.   ------  Notes recorded by [name], [name], MD on 12/07/2016 at 8:05 AM EST Please contact the patient with her vitamin D level which rose again only slightly.  I am recommending taking vitamin D 50,000 IU twice weekly for 3 months and then have a lab visit.  Please send to pharmacy of choice. Please schedule a lab visit in 3 months.

## 2016-12-07 NOTE — Telephone Encounter (Signed)
Spoke with patient, advised as seen below per Dr. Edward JollySilva. Rx for Vitamin D to verified pharmacy. Lab visit scheduled for 03/16/17 at 9:45am. Patient verbalizes understanding and is agreeable. Will close encounter.

## 2017-03-16 ENCOUNTER — Other Ambulatory Visit: Payer: 59

## 2017-03-16 DIAGNOSIS — E559 Vitamin D deficiency, unspecified: Secondary | ICD-10-CM

## 2017-03-17 LAB — VITAMIN D 25 HYDROXY (VIT D DEFICIENCY, FRACTURES): Vit D, 25-Hydroxy: 27.7 ng/mL — ABNORMAL LOW (ref 30.0–100.0)

## 2017-05-23 ENCOUNTER — Ambulatory Visit: Payer: 59 | Admitting: Obstetrics and Gynecology

## 2017-05-23 ENCOUNTER — Encounter: Payer: Self-pay | Admitting: Obstetrics and Gynecology

## 2017-05-23 ENCOUNTER — Other Ambulatory Visit: Payer: Self-pay

## 2017-05-23 VITALS — BP 118/62 | HR 72 | Resp 16 | Ht 66.0 in | Wt 182.0 lb

## 2017-05-23 DIAGNOSIS — Z01419 Encounter for gynecological examination (general) (routine) without abnormal findings: Secondary | ICD-10-CM | POA: Diagnosis not present

## 2017-05-23 MED ORDER — CITALOPRAM HYDROBROMIDE 20 MG PO TABS: ORAL_TABLET | ORAL | 11 refills | Status: DC

## 2017-05-23 NOTE — Patient Instructions (Signed)
EXERCISE AND DIET:  We recommended that you start or continue a regular exercise program for good health. Regular exercise means any activity that makes your heart beat faster and makes you sweat.  We recommend exercising at least 30 minutes per day at least 3 days a week, preferably 4 or 5.  We also recommend a diet low in fat and sugar.  Inactivity, poor dietary choices and obesity can cause diabetes, heart attack, stroke, and kidney damage, among others.    ALCOHOL AND SMOKING:  Women should limit their alcohol intake to no more than 7 drinks/beers/glasses of wine (combined, not each!) per week. Moderation of alcohol intake to this level decreases your risk of breast cancer and liver damage. And of course, no recreational drugs are part of a healthy lifestyle.  And absolutely no smoking or even second hand smoke. Most people know smoking can cause heart and lung diseases, but did you know it also contributes to weakening of your bones? Aging of your skin?  Yellowing of your teeth and nails?  CALCIUM AND VITAMIN D:  Adequate intake of calcium and Vitamin D are recommended.  The recommendations for exact amounts of these supplements seem to change often, but generally speaking 600 mg of calcium (either carbonate or citrate) and 800 units of Vitamin D per day seems prudent. Certain women may benefit from higher intake of Vitamin D.  If you are among these women, your doctor will have told you during your visit.    PAP SMEARS:  Pap smears, to check for cervical cancer or precancers,  have traditionally been done yearly, although recent scientific advances have shown that most women can have pap smears less often.  However, every woman still should have a physical exam from her gynecologist every year. It will include a breast check, inspection of the vulva and vagina to check for abnormal growths or skin changes, a visual exam of the cervix, and then an exam to evaluate the size and shape of the uterus and  ovaries.  And after 38 years of age, a rectal exam is indicated to check for rectal cancers. We will also provide age appropriate advice regarding health maintenance, like when you should have certain vaccines, screening for sexually transmitted diseases, bone density testing, colonoscopy, mammograms, etc.   MAMMOGRAMS:  All women over 40 years old should have a yearly mammogram. Many facilities now offer a "3D" mammogram, which may cost around $50 extra out of pocket. If possible,  we recommend you accept the option to have the 3D mammogram performed.  It both reduces the number of women who will be called back for extra views which then turn out to be normal, and it is better than the routine mammogram at detecting truly abnormal areas.    COLONOSCOPY:  Colonoscopy to screen for colon cancer is recommended for all women at age 50.  We know, you hate the idea of the prep.  We agree, BUT, having colon cancer and not knowing it is worse!!  Colon cancer so often starts as a polyp that can be seen and removed at colonscopy, which can quite literally save your life!  And if your first colonoscopy is normal and you have no family history of colon cancer, most women don't have to have it again for 10 years.  Once every ten years, you can do something that may end up saving your life, right?  We will be happy to help you get it scheduled when you are ready.    Be sure to check your insurance coverage so you understand how much it will cost.  It may be covered as a preventative service at no cost, but you should check your particular policy.      Kegel Exercises Kegel exercises help strengthen the muscles that support the rectum, vagina, small intestine, bladder, and uterus. Doing Kegel exercises can help:  Improve bladder and bowel control.  Improve sexual response.  Reduce problems and discomfort during pregnancy.  Kegel exercises involve squeezing your pelvic floor muscles, which are the same muscles you  squeeze when you try to stop the flow of urine. The exercises can be done while sitting, standing, or lying down, but it is best to vary your position. Phase 1 exercises 1. Squeeze your pelvic floor muscles tight. You should feel a tight lift in your rectal area. If you are a female, you should also feel a tightness in your vaginal area. Keep your stomach, buttocks, and legs relaxed. 2. Hold the muscles tight for up to 10 seconds. 3. Relax your muscles. Repeat this exercise 50 times a day or as many times as told by your health care provider. Continue to do this exercise for at least 4-6 weeks or for as long as told by your health care provider. This information is not intended to replace advice given to you by your health care provider. Make sure you discuss any questions you have with your health care provider. Document Released: 12/27/2011 Document Revised: 09/04/2015 Document Reviewed: 11/29/2014 Elsevier Interactive Patient Education  2018 Elsevier Inc.  

## 2017-05-23 NOTE — Progress Notes (Signed)
37 y.o. G36P0032 Married Caucasian female here for annual exam.    On Celexa for PMDD. Taking for 10 - 11 days, and this works well.  Menses every 28 - 35 days.  Spotting at beginning of menses and then normal bleeding begins.  Cramping is manageable with ibuprofen.  Occurs for the first day.   Still with some LLQ pain prior to menses starting.  Normal pelvic US one year ago.   Urinary incontinence with cough/sneeze since childbirth.  Not progressive.   Moving to a new home.   PCP:  Dr. Sigmund Hazel   Patient's last menstrual period was 04/30/2017.     Period Cycle (Days): (irregular) Period Duration (Days): 6-10 Period Pattern: (!) Irregular Menstrual Flow: Moderate, Heavy Menstrual Control: Maxi pad Menstrual Control Change Freq (Hours): 3-4 Dysmenorrhea: (!) Moderate Dysmenorrhea Symptoms: Cramping     Sexually active: Yes.    The current method of family planning is Fallopian tubes removed.    Exercising: Yes.    walking Smoker:  no  Health Maintenance: Pap:  05/12/16 pap and HR HPV negative History of abnormal Pap:  no MMG:  07/02/13 Diagnostic MM Bilateral - BIRADS 1 negative/density c TDaP:  2010 Gardasil:   no HIV:  Negative in pregnancy.  Hep C:  NA Screening Labs:   ----    reports that she has never smoked. She has never used smokeless tobacco. She reports that she drinks about 2.4 oz of alcohol per week. She reports that she does not use drugs.  Past Medical History:  Diagnosis Date  . Anxiety    no med  . Family history of ovarian cancer   . Family history of uterine cancer   . 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 Low vitamin D level   . Migraines    menstrual  . SVD (spontaneous vaginal delivery)    x 2    Past Surgical History:  Procedure Laterality Date  . ABLATION ON ENDOMETRIOSIS N/A 08/11/2013   Procedure: Elenor Quinones OF ENDOMETRIOSIS, PERITONEAL BIOPSY;  Surgeon: Forrestine Him Amundson de Gwenevere Ghazi, MD;  Location: [facility];  Service: Gynecology;  Laterality: N/A;  . DILATION AND CURETTAGE OF UTERUS     x2  . LAPAROSCOPIC BILATERAL SALPINGECTOMY Bilateral 08/11/2013   Procedure: LAPAROSCOPIC BILATERAL SALPINGECTOMY;  Surgeon: Forrestine Him Amundson de Gwenevere Ghazi, MD;  Location: [facility];  Service: Gynecology;  Laterality: Bilateral;    Current Outpatient Medications  Medication Sig Dispense Refill  . Cholecalciferol (VITAMIN D3) 1000 units CAPS Take 1 capsule by mouth daily.    . citalopram (CELEXA) 20 MG tablet Take 1 tablet (20 mg) by mouth daily for 2 weeks per month. 15 tablet 6  . diphenhydrAMINE (SOMINEX) 25 MG tablet Take 25 mg by mouth at bedtime as needed for sleep.    Marland Kitchen ibuprofen (ADVIL,MOTRIN) 800 MG tablet Take 1 tablet (800 mg total) by mouth every 8 (eight) hours as needed. 30 tablet 0   No current facility-administered medications for this visit.     Family History  Problem Relation Age of Onset  . Hypertension Mother   . Hypertension Paternal Grandmother   . Ovarian cancer Paternal Grandmother 15  . Cancer Paternal Grandmother 69       Ovarian  . Leukemia Father   . Ovarian cancer Maternal Aunt 7       deceased age 34  . Breast cancer Maternal Aunt 42  .  Uterine cancer Maternal Aunt        dx in her 73s  . Diabetes Paternal Uncle   . Diabetes Maternal Grandmother   . Breast cancer Maternal Aunt 21       deceased age 2  . Cancer Maternal Aunt 51       ?endometrial ca  . Stroke Paternal Grandfather   . Cancer Maternal Uncle        ocular cancer - melanoma    Review of Systems  Constitutional:       Weight gain  HENT: Negative.   Eyes: Negative.   Respiratory: Negative.   Cardiovascular: Negative.   Gastrointestinal: Negative.   Endocrine: Negative.   Genitourinary:       Loss of urine with sneeze or cough  Musculoskeletal: Negative.   Skin: Negative.   Allergic/Immunologic: Negative.   Neurological: Negative.   Hematological: Negative.    Psychiatric/Behavioral: Negative.     Exam:   BP 118/62 (BP Location: Right Arm, Patient Position: Sitting, Cuff Size: Normal)   Pulse 72   Resp 16   Ht  (1.676 m)   Wt 182 lb (82.6 kg)   LMP 04/30/2017   BMI 29.38 kg/m     General appearance: alert, cooperative and appears stated age Head: Normocephalic, without obvious abnormality, atraumatic Neck: no adenopathy, supple, symmetrical, trachea midline and thyroid normal to inspection and palpation Lungs: clear to auscultation bilaterally Breasts: normal appearance, no masses or tenderness, No nipple retraction or dimpling, No nipple discharge or bleeding, No axillary or supraclavicular adenopathy Heart: regular rate and rhythm Abdomen: soft, non-tender; no masses, no organomegaly Extremities: extremities normal, atraumatic, no cyanosis or edema Skin: Skin color, texture, turgor normal. No rashes or lesions Lymph nodes: Cervical, supraclavicular, and axillary nodes normal. No abnormal inguinal nodes palpated Neurologic: Grossly normal  Pelvic: External genitalia:  no lesions              Urethra:  normal appearing urethra with no masses, tenderness or lesions              Bartholins and Skenes: normal                 Vagina: normal appearing vagina with normal color and discharge, no lesions              Cervix: no lesions              Pap taken: No. Bimanual Exam:  Uterus:  normal size, contour, position, consistency, mobility, non-tender              Adnexa: no mass, fullness, tenderness          Chaperone was present for exam.  Assessment:   Well woman visit with normal exam. Status post bilateral salpingectomy and tx of endometriosis. Menstrual migraines.  FH breast, uterine, ovarian cancer.  Increased risk of melanoma and kidney cancer by genetic testing.  PMDD. On Celexa.  GSI.  Low vit D.   Plan: Mammogram screening. Recommended self breast awareness. Pap and HR HPV as above. Guidelines for Calcium,  Vitamin D, regular exercise program including cardiovascular and weight bearing exercise. Continue Celexa. Kegel's. Discussed PT and briefly mentioned midurethral sling.  Routine labs.  Follow up annually and prn.   After visit summary provided.

## 2017-05-24 LAB — COMPREHENSIVE METABOLIC PANEL
ALBUMIN: 4.6 g/dL (ref 3.5–5.5)
ALK PHOS: 70 IU/L (ref 39–117)
ALT: 17 IU/L (ref 0–32)
AST: 17 IU/L (ref 0–40)
Albumin/Globulin Ratio: 1.9 (ref 1.2–2.2)
BILIRUBIN TOTAL: 0.5 mg/dL (ref 0.0–1.2)
BUN / CREAT RATIO: 13 (ref 9–23)
BUN: 10 mg/dL (ref 6–20)
CHLORIDE: 98 mmol/L (ref 96–106)
CO2: 23 mmol/L (ref 20–29)
CREATININE: 0.78 mg/dL (ref 0.57–1.00)
Calcium: 9 mg/dL (ref 8.7–10.2)
GFR calc non Af Amer: 97 mL/min/{1.73_m2} (ref >59)
GFR, EST AFRICAN AMERICAN: 112 mL/min/{1.73_m2} (ref >59)
Globulin, Total: 2.4 g/dL (ref 1.5–4.5)
Glucose: 87 mg/dL (ref 65–99)
Potassium: 4.3 mmol/L (ref 3.5–5.2)
SODIUM: 137 mmol/L (ref 134–144)
TOTAL PROTEIN: 7 g/dL (ref 6.0–8.5)

## 2017-05-24 LAB — CBC
HEMATOCRIT: 38.8 % (ref 34.0–46.6)
HEMOGLOBIN: 12.8 g/dL (ref 11.1–15.9)
MCH: 28.1 pg (ref 26.6–33.0)
MCHC: 33 g/dL (ref 31.5–35.7)
MCV: 85 fL (ref 79–97)
Platelets: 226 10*3/uL (ref 150–379)
RBC: 4.55 x10E6/uL (ref 3.77–5.28)
RDW: 13.6 % (ref 12.3–15.4)
WBC: 5.4 10*3/uL (ref 3.4–10.8)

## 2017-05-24 LAB — LIPID PANEL
CHOLESTEROL TOTAL: 184 mg/dL (ref 100–199)
Chol/HDL Ratio: 3.9 ratio (ref 0.0–4.4)
HDL: 47 mg/dL (ref >39)
LDL CALC: 119 mg/dL — AB (ref 0–99)
Triglycerides: 90 mg/dL (ref 0–149)
VLDL Cholesterol Cal: 18 mg/dL (ref 5–40)

## 2017-05-24 LAB — TSH: TSH: 3.16 u[IU]/mL (ref 0.450–4.500)

## 2017-05-24 LAB — VITAMIN D 25 HYDROXY (VIT D DEFICIENCY, FRACTURES): Vit D, 25-Hydroxy: 19 ng/mL — ABNORMAL LOW (ref 30.0–100.0)

## 2017-05-25 MED ORDER — VITAMIN D (ERGOCALCIFEROL) 1.25 MG (50000 UNIT) PO CAPS: 50000.0000 [IU] | ORAL_CAPSULE | ORAL | 3 refills | Status: DC

## 2017-05-25 NOTE — Addendum Note (Signed)
Addended by: Ardell Isaacs, Debbe Bales E on: 05/25/2017 04:26 PM   Modules accepted: Orders

## 2017-07-19 DIAGNOSIS — R1011 Right upper quadrant pain: Secondary | ICD-10-CM | POA: Diagnosis not present

## 2017-07-21 ENCOUNTER — Other Ambulatory Visit: Payer: Self-pay | Admitting: Family Medicine

## 2017-07-21 DIAGNOSIS — R1011 Right upper quadrant pain: Secondary | ICD-10-CM

## 2017-07-29 ENCOUNTER — Other Ambulatory Visit: Payer: Self-pay | Admitting: Obstetrics and Gynecology

## 2017-07-30 ENCOUNTER — Other Ambulatory Visit: Payer: 59

## 2017-07-30 ENCOUNTER — Ambulatory Visit
Admission: RE | Admit: 2017-07-30 | Discharge: 2017-07-30 | Disposition: A | Discharge status: Home / Self Care | Payer: 59 | Source: Ambulatory Visit | Attending: Family Medicine | Admitting: Family Medicine

## 2017-07-30 DIAGNOSIS — R1011 Right upper quadrant pain: Secondary | ICD-10-CM | POA: Diagnosis not present

## 2017-07-30 NOTE — Telephone Encounter (Signed)
Medication refill request: Celexa 20 MG tab Last AEX:  05/23/17 Next AEX: 06/14/18 Last MMG (if hormonal medication request): 07/02/13 no evidence of malignance on either breast.  Refill authorized: please refill or deny if appropriate.

## 2017-08-10 ENCOUNTER — Other Ambulatory Visit (HOSPITAL_COMMUNITY): Payer: Self-pay | Admitting: Family Medicine

## 2017-08-10 DIAGNOSIS — R1011 Right upper quadrant pain: Secondary | ICD-10-CM

## 2017-08-16 ENCOUNTER — Encounter (HOSPITAL_COMMUNITY)
Admission: RE | Admit: 2017-08-16 | Discharge: 2017-08-16 | Disposition: A | Discharge status: Home / Self Care | Payer: 59 | Source: Ambulatory Visit | Attending: Family Medicine | Admitting: Family Medicine

## 2017-08-16 DIAGNOSIS — R1011 Right upper quadrant pain: Secondary | ICD-10-CM | POA: Diagnosis present

## 2017-08-16 MED ORDER — TECHNETIUM TC 99M MEBROFENIN IV KIT
5.2000 | PACK | Freq: Once | INTRAVENOUS | Status: AC | PRN
  Administered 2017-08-16: 5.2 via INTRAVENOUS

## 2017-09-14 DIAGNOSIS — K828 Other specified diseases of gallbladder: Secondary | ICD-10-CM | POA: Diagnosis not present

## 2017-09-20 ENCOUNTER — Other Ambulatory Visit: Payer: Self-pay | Admitting: General Surgery

## 2017-09-20 DIAGNOSIS — K8010 Calculus of gallbladder with chronic cholecystitis without obstruction: Secondary | ICD-10-CM | POA: Diagnosis not present

## 2017-09-20 DIAGNOSIS — K811 Chronic cholecystitis: Secondary | ICD-10-CM | POA: Diagnosis not present

## 2017-09-20 DIAGNOSIS — K828 Other specified diseases of gallbladder: Secondary | ICD-10-CM | POA: Diagnosis not present

## 2017-11-03 ENCOUNTER — Other Ambulatory Visit: Payer: Self-pay | Admitting: Obstetrics and Gynecology

## 2017-11-05 NOTE — Telephone Encounter (Signed)
Medication refill request: Vitamin D Last AEX:  05/23/2017 Next AEX: 06/14/2018 Last MMG (if hormonal medication request): 07/02/2013 BI-RADS CATEGORY  1: Negative Refill authorized: refills done in may.  Rx refused.

## 2018-06-14 ENCOUNTER — Encounter: Payer: Self-pay | Admitting: Obstetrics and Gynecology

## 2018-06-14 ENCOUNTER — Ambulatory Visit: Payer: 59 | Admitting: Obstetrics and Gynecology

## 2018-06-14 ENCOUNTER — Other Ambulatory Visit: Payer: Self-pay

## 2018-06-14 VITALS — BP 110/70 | HR 92 | Temp 97.9°F | Ht 65.75 in | Wt 177.0 lb

## 2018-06-14 DIAGNOSIS — Z23 Encounter for immunization: Secondary | ICD-10-CM

## 2018-06-14 DIAGNOSIS — E7800 Pure hypercholesterolemia, unspecified: Secondary | ICD-10-CM

## 2018-06-14 DIAGNOSIS — R748 Abnormal levels of other serum enzymes: Secondary | ICD-10-CM

## 2018-06-14 DIAGNOSIS — Z01419 Encounter for gynecological examination (general) (routine) without abnormal findings: Secondary | ICD-10-CM

## 2018-06-14 DIAGNOSIS — R7989 Other specified abnormal findings of blood chemistry: Secondary | ICD-10-CM | POA: Diagnosis not present

## 2018-06-14 NOTE — Progress Notes (Signed)
38 y.o. 195P0032 Married Caucasian female here for annual exam.    Some stress during Covid 19.   Taking Celexa during her PMS period of time.  She is doing well taking it in this fashion.  Menses every 21 to 30 days.  Manageable.   Leaking a little with sneezing, coughing, laughing, and jumping on trampoline.  No urgency or spontaneous leakage or urine. Wears a panty liner daily. No constipation or splinting.   Unable to wear tampons.  States she gets infections.   PCP:  Sigmund HazelLisa Miller, MD   Patient's last menstrual period was 05/28/2018.           Sexually active: Yes.    The current method of family planning is Fallopian tubes removed.    Exercising: Yes.    treadmill Smoker:  no  Health Maintenance: Pap:  05-12-16 neg HPV HR neg History of abnormal Pap:  no MMG:  07-02-13 diagnostic MM Bilateral density c, birads:1 neg TDaP:  2010?Marland Kitchen. Gardasil:   no HIV: with pregnancy   Hep C: n/a Screening Labs: Vit D    reports that she has never smoked. She has never used smokeless tobacco. She reports current alcohol use of about 4.0 standard drinks of alcohol per week. She reports that she does not use drugs.  Past Medical History:  Diagnosis Date  . Anxiety    no med  . Family history of ovarian cancer   . Family history of uterine cancer   . 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 Low vitamin D level   . Migraines    menstrual  . SVD (spontaneous vaginal delivery)    x 2    Past Surgical History:  Procedure Laterality Date  . ABLATION ON ENDOMETRIOSIS N/A 08/11/2013   Procedure: Elenor QuinonesFULGERATION OF ENDOMETRIOSIS, PERITONEAL BIOPSY;  Surgeon: Forrestine HimBrook E Amundson de Gwenevere Ghaziarvalho E Silva, MD;  Location: [facility];  Service: Gynecology;  Laterality: N/A;  . CHOLECYSTECTOMY    . DILATION AND CURETTAGE OF UTERUS     x2  . LAPAROSCOPIC BILATERAL SALPINGECTOMY Bilateral 08/11/2013   Procedure: LAPAROSCOPIC BILATERAL SALPINGECTOMY;  Surgeon: Forrestine HimBrook E  Amundson de Gwenevere Ghaziarvalho E Silva, MD;  Location: [facility];  Service: Gynecology;  Laterality: Bilateral;    Current Outpatient Medications  Medication Sig Dispense Refill  . Cholecalciferol (VITAMIN D3) 1000 units CAPS Take 1 capsule by mouth daily.    . citalopram (CELEXA) 20 MG tablet TAKE 1 TABLET BY MOUTH EVERY DAY FOR 2 WEEKS PER MONTH 15 tablet 9  . diphenhydrAMINE (SOMINEX) 25 MG tablet Take 25 mg by mouth at bedtime as needed for sleep.    . Vitamin D, Ergocalciferol, (DRISDOL) 50000 units CAPS capsule Take 1 capsule (50,000 Units total) by mouth every 7 (seven) days. 12 capsule 3   No current facility-administered medications for this visit.     Family History  Problem Relation Age of Onset  . Hypertension Mother   . Hypertension Paternal Grandmother   . Ovarian cancer Paternal Grandmother [phone]  . Cancer Paternal Grandmother [id number]       Ovarian  . Leukemia Father   . Ovarian cancer Maternal Aunt [phone]       deceased age 39  . Breast cancer Maternal Aunt 42  . Uterine cancer Maternal Aunt        dx in her 4740s  . Diabetes Paternal Uncle   . Diabetes Maternal Grandmother   . Breast cancer Maternal  Aunt 29       deceased age 81  . Cancer Maternal Aunt 51       ?endometrial ca  . Stroke Paternal Grandfather   . Cancer Maternal Uncle        ocular cancer - melanoma    Review of Systems  All other systems reviewed and are negative.   Exam:   BP 110/70   Pulse 92   Temp 97.9 F (36.6 C) (Temporal)   Ht 5' 5.75" (1.67 m)   Wt 177 lb (80.3 kg)   LMP 05/28/2018   BMI 28.79 kg/m     General appearance: alert, cooperative and appears stated age Head: Normocephalic, without obvious abnormality, atraumatic Neck: no adenopathy, supple, symmetrical, trachea midline and thyroid normal to inspection and palpation Lungs: clear to auscultation bilaterally Breasts: normal appearance, no masses or tenderness, No nipple retraction or dimpling, No nipple discharge or bleeding, No axillary or  supraclavicular adenopathy Heart: regular rate and rhythm Abdomen: soft, non-tender; no masses, no organomegaly Extremities: extremities normal, atraumatic, no cyanosis or edema Skin: Skin color, texture, turgor normal. No rashes or lesions Lymph nodes: Cervical, supraclavicular, and axillary nodes normal. No abnormal inguinal nodes palpated Neurologic: Grossly normal  Pelvic: External genitalia:  no lesions              Urethra:  normal appearing urethra with no masses, tenderness or lesions              Bartholins and Skenes: normal                 Vagina: normal appearing vagina with normal color and discharge, no lesions.  Good vaginal and uterine support.               Cervix: no lesions              Pap taken: No. Bimanual Exam:  Uterus:  normal size, contour, position, consistency, mobility, non-tender              Adnexa: no mass, fullness, tenderness              Chaperone was present for exam.  Assessment:   Well woman visit with normal exam. Status post bilateral salpingectomy and tx of endometriosis. Menstrual migraines.  FH breast, uterine, ovarian cancer.  Increased risk of melanoma and kidney cancer by genetic testing.  PMDD. On Celexa.  GSI.  Low vit D.   Plan: Mammogram screening next year. Recommended self breast awareness. Pap and HR HPV as above. Guidelines for Calcium, Vitamin D, regular exercise program including cardiovascular and weight bearing exercise. I discussed pelvic floor therapy, Impressa, pessaru use, and midurethral sling.  HPV vaccine series.  Tdap today. Does derm checks yearly.  We discussed midurethral sling risks and benefits.  Efficacy of 85 - 90%.  Potential risks of urinary retention, healing problems, new onset urgency/frequency, slower voiding reviewed.  She will consider for the future.  ACOG HOs on urinary incontinence and surgery for urinary incontinence. Routine labs.  Follow up annually and prn.   After visit summary  provided.

## 2018-06-14 NOTE — Patient Instructions (Signed)

## 2018-06-17 LAB — COMPREHENSIVE METABOLIC PANEL
ALT: 34 IU/L — ABNORMAL HIGH (ref 0–32)
AST: 27 IU/L (ref 0–40)
Albumin/Globulin Ratio: 1.9 (ref 1.2–2.2)
Albumin: 4.7 g/dL (ref 3.8–4.8)
Alkaline Phosphatase: 81 IU/L (ref 39–117)
BUN/Creatinine Ratio: 15 (ref 9–23)
BUN: 12 mg/dL (ref 6–20)
Bilirubin Total: 0.3 mg/dL (ref 0.0–1.2)
CO2: 25 mmol/L (ref 20–29)
Calcium: 9.4 mg/dL (ref 8.7–10.2)
Chloride: 99 mmol/L (ref 96–106)
Creatinine, Ser: 0.79 mg/dL (ref 0.57–1.00)
GFR calc Af Amer: 109 mL/min/{1.73_m2} (ref >59)
GFR calc non Af Amer: 95 mL/min/{1.73_m2} (ref >59)
Globulin, Total: 2.5 g/dL (ref 1.5–4.5)
Glucose: 88 mg/dL (ref 65–99)
Potassium: 4.4 mmol/L (ref 3.5–5.2)
Sodium: 140 mmol/L (ref 134–144)
Total Protein: 7.2 g/dL (ref 6.0–8.5)

## 2018-06-17 LAB — VITAMIN D 25 HYDROXY (VIT D DEFICIENCY, FRACTURES): Vit D, 25-Hydroxy: 16.1 ng/mL — ABNORMAL LOW (ref 30.0–100.0)

## 2018-06-17 LAB — CBC
Hematocrit: 39.1 % (ref 34.0–46.6)
Hemoglobin: 13 g/dL (ref 11.1–15.9)
MCH: 28.7 pg (ref 26.6–33.0)
MCHC: 33.2 g/dL (ref 31.5–35.7)
MCV: 86 fL (ref 79–97)
Platelets: 253 10*3/uL (ref 150–450)
RBC: 4.53 x10E6/uL (ref 3.77–5.28)
RDW: 12.5 % (ref 11.7–15.4)
WBC: 7.1 10*3/uL (ref 3.4–10.8)

## 2018-06-17 LAB — LIPID PANEL
Chol/HDL Ratio: 3.9 ratio (ref 0.0–4.4)
Cholesterol, Total: 217 mg/dL — ABNORMAL HIGH (ref 100–199)
HDL: 55 mg/dL (ref >39)
LDL Calculated: 147 mg/dL — ABNORMAL HIGH (ref 0–99)
Triglycerides: 77 mg/dL (ref 0–149)
VLDL Cholesterol Cal: 15 mg/dL (ref 5–40)

## 2018-06-18 ENCOUNTER — Other Ambulatory Visit: Payer: Self-pay | Admitting: Obstetrics and Gynecology

## 2018-06-18 ENCOUNTER — Telehealth: Payer: Self-pay | Admitting: Obstetrics and Gynecology

## 2018-06-18 ENCOUNTER — Encounter: Payer: Self-pay | Admitting: Obstetrics and Gynecology

## 2018-06-18 MED ORDER — CITALOPRAM HYDROBROMIDE 20 MG PO TABS: ORAL_TABLET | ORAL | 3 refills | Status: DC

## 2018-06-18 NOTE — Telephone Encounter (Signed)
Patient forgot to ask for refill on her Citalopram 20mg  at [facility] last week. Rx will expire next month. Request routed to provider.

## 2018-06-18 NOTE — Telephone Encounter (Signed)
I refilled her Celexa and gave her a 3 month supply with refills until her annual exam is due next year.

## 2018-06-18 NOTE — Telephone Encounter (Signed)
Patient sent the following correspondence through MyChart. Routing to triage to assist patient with request.  Dr. Edward Jolly,    Thank you for your time last week. I wanted to check with you regarding refills for the Celexa/citalopram and see if that will be something you will send to the pharmacy ([address] [facility]) or do I need to come by the office to pick up the prescription? I am not about to run out, but my current prescription expires in another month or month and half.     Thanks again.    Jane Vaughn

## 2018-06-18 NOTE — Telephone Encounter (Signed)
Patient notified- Rx sent to pharmacy. 

## 2018-06-20 NOTE — Addendum Note (Signed)
Addended by: Ardell Isaacs, Debbe Bales E on: 06/20/2018 05:33 PM   Modules accepted: Orders

## 2018-06-21 ENCOUNTER — Other Ambulatory Visit: Payer: Self-pay | Admitting: *Deleted

## 2018-06-21 MED ORDER — VITAMIN D (ERGOCALCIFEROL) 1.25 MG (50000 UNIT) PO CAPS: 50000.0000 [IU] | ORAL_CAPSULE | ORAL | 0 refills | Status: DC

## 2018-08-13 NOTE — Progress Notes (Signed)
Patient in today for second Gardasil injection.   Contraception: bilateral salpingectomy  LMP: 08-15-2018 Last AEX: 06-14-2018 with Dr. Quincy Simmonds  Injection given in left deltoid. Patient tolerated shot well.   Patient informed next injection due in about 4 months. (after 12-16-2018).  Advised patient, if not on birth control, to return for next injection with cycle.   Routed to provider for final review.  Encounter closed.

## 2018-08-15 ENCOUNTER — Other Ambulatory Visit: Payer: Self-pay

## 2018-08-15 ENCOUNTER — Ambulatory Visit (INDEPENDENT_AMBULATORY_CARE_PROVIDER_SITE_OTHER): Payer: 59 | Admitting: *Deleted

## 2018-08-15 VITALS — BP 118/70 | HR 72 | Temp 98.0°F | Resp 14 | Ht 66.0 in | Wt 172.2 lb

## 2018-08-15 DIAGNOSIS — Z23 Encounter for immunization: Secondary | ICD-10-CM

## 2018-09-08 ENCOUNTER — Other Ambulatory Visit: Payer: Self-pay | Admitting: Obstetrics and Gynecology

## 2018-09-24 ENCOUNTER — Other Ambulatory Visit: Payer: Self-pay

## 2018-09-25 ENCOUNTER — Other Ambulatory Visit (INDEPENDENT_AMBULATORY_CARE_PROVIDER_SITE_OTHER): Payer: 59

## 2018-09-25 DIAGNOSIS — E7800 Pure hypercholesterolemia, unspecified: Secondary | ICD-10-CM

## 2018-09-25 DIAGNOSIS — R7989 Other specified abnormal findings of blood chemistry: Secondary | ICD-10-CM

## 2018-09-25 DIAGNOSIS — R748 Abnormal levels of other serum enzymes: Secondary | ICD-10-CM

## 2018-09-26 LAB — LIPID PANEL
Chol/HDL Ratio: 4.6 ratio — ABNORMAL HIGH (ref 0.0–4.4)
Cholesterol, Total: 205 mg/dL — ABNORMAL HIGH (ref 100–199)
HDL: 45 mg/dL (ref >39)
LDL Chol Calc (NIH): 140 mg/dL — ABNORMAL HIGH (ref 0–99)
Triglycerides: 111 mg/dL (ref 0–149)
VLDL Cholesterol Cal: 20 mg/dL (ref 5–40)

## 2018-09-26 LAB — HEPATIC FUNCTION PANEL
ALT: 20 IU/L (ref 0–32)
AST: 20 IU/L (ref 0–40)
Albumin: 4.5 g/dL (ref 3.8–4.8)
Alkaline Phosphatase: 82 IU/L (ref 39–117)
Bilirubin Total: 0.6 mg/dL (ref 0.0–1.2)
Bilirubin, Direct: 0.13 mg/dL (ref 0.00–0.40)
Total Protein: 6.9 g/dL (ref 6.0–8.5)

## 2018-09-26 LAB — VITAMIN D 25 HYDROXY (VIT D DEFICIENCY, FRACTURES): Vit D, 25-Hydroxy: 37.8 ng/mL (ref 30.0–100.0)

## 2018-10-01 ENCOUNTER — Encounter: Payer: Self-pay | Admitting: Obstetrics and Gynecology

## 2018-10-01 MED ORDER — VITAMIN D (ERGOCALCIFEROL) 1.25 MG (50000 UNIT) PO CAPS: 50000.0000 [IU] | ORAL_CAPSULE | ORAL | 3 refills | Status: DC

## 2018-12-17 NOTE — Progress Notes (Signed)
Patient in today for 3rd Gardasil injection.   Contraception: Bilateral Salpingectomy  LMP: 12/23/18 Last AEX: 06/14/18 with Dr. Quincy Simmonds  Injection given in Right Deltoid. Patient tolerated shot well.   Patient informed that she is finished with the series.  Routed to provider for final review.  Encounter closed.

## 2018-12-23 ENCOUNTER — Ambulatory Visit (INDEPENDENT_AMBULATORY_CARE_PROVIDER_SITE_OTHER): Payer: 59

## 2018-12-23 ENCOUNTER — Other Ambulatory Visit: Payer: Self-pay

## 2018-12-23 VITALS — BP 100/64 | HR 80 | Temp 96.4°F | Ht 66.5 in | Wt 175.0 lb

## 2018-12-23 DIAGNOSIS — Z23 Encounter for immunization: Secondary | ICD-10-CM | POA: Diagnosis not present

## 2019-01-27 ENCOUNTER — Other Ambulatory Visit: Payer: Self-pay | Admitting: Obstetrics and Gynecology

## 2019-01-27 DIAGNOSIS — Z1231 Encounter for screening mammogram for malignant neoplasm of breast: Secondary | ICD-10-CM

## 2019-02-23 ENCOUNTER — Other Ambulatory Visit: Payer: Self-pay | Admitting: Obstetrics and Gynecology

## 2019-02-24 NOTE — Telephone Encounter (Signed)
Medication refill request: Citalopram Last AEX:  06/14/18 BS Next AEX: 06/16/19 Last MMG (if hormonal medication request): 07-02-13 diagnostic MM Bilateral density c, birads:1 neg Refill authorized: Please advise; Order pended #45 w/1 refill if authorized

## 2019-03-06 ENCOUNTER — Other Ambulatory Visit: Payer: Self-pay

## 2019-03-06 ENCOUNTER — Ambulatory Visit
Admission: RE | Admit: 2019-03-06 | Discharge: 2019-03-06 | Disposition: A | Discharge status: Home / Self Care | Payer: 59 | Source: Ambulatory Visit | Attending: Obstetrics and Gynecology | Admitting: Obstetrics and Gynecology

## 2019-03-06 DIAGNOSIS — Z1231 Encounter for screening mammogram for malignant neoplasm of breast: Secondary | ICD-10-CM

## 2019-03-10 ENCOUNTER — Other Ambulatory Visit: Payer: Self-pay | Admitting: Obstetrics and Gynecology

## 2019-03-10 DIAGNOSIS — N6489 Other specified disorders of breast: Secondary | ICD-10-CM

## 2019-03-21 ENCOUNTER — Ambulatory Visit
Admission: RE | Admit: 2019-03-21 | Discharge: 2019-03-21 | Disposition: A | Discharge status: Home / Self Care | Payer: 59 | Source: Ambulatory Visit | Attending: Obstetrics and Gynecology | Admitting: Obstetrics and Gynecology

## 2019-03-21 ENCOUNTER — Other Ambulatory Visit: Payer: Self-pay

## 2019-03-21 DIAGNOSIS — N6489 Other specified disorders of breast: Secondary | ICD-10-CM

## 2019-03-25 ENCOUNTER — Other Ambulatory Visit: Payer: Self-pay | Admitting: Obstetrics and Gynecology

## 2019-04-05 ENCOUNTER — Ambulatory Visit: Payer: 59 | Attending: Internal Medicine

## 2019-04-05 DIAGNOSIS — Z23 Encounter for immunization: Secondary | ICD-10-CM

## 2019-04-05 NOTE — Progress Notes (Signed)
   Covid-19 Vaccination Clinic  Name:  NARIA ABBEY    MRN: [id number] DOB: 1980-05-16  04/05/2019  Ms. Heeter was observed post Covid-19 immunization for 15 minutes without incident. She was provided with Vaccine Information Sheet and instruction to access the V-Safe system.   Ms. Wisher was instructed to call 911 with any severe reactions post vaccine: Marland Kitchen Difficulty breathing  . Swelling of face and throat  . A fast heartbeat  . A bad rash all over body  . Dizziness and weakness   Immunizations Administered    Name Date Dose VIS Date Route   Pfizer COVID-19 Vaccine 04/05/2019 10:40 AM 0.3 mL 01/03/2019 Intramuscular   Manufacturer: [employer], [employer]   Lot: [id number]   NDC: [address]-7

## 2019-04-28 ENCOUNTER — Ambulatory Visit: Payer: 59

## 2019-04-30 ENCOUNTER — Ambulatory Visit: Payer: 59 | Attending: Internal Medicine

## 2019-04-30 DIAGNOSIS — Z23 Encounter for immunization: Secondary | ICD-10-CM

## 2019-04-30 NOTE — Progress Notes (Signed)
   Covid-19 Vaccination Clinic  Name:  Jane Vaughn    MRN: [id number] DOB: February 15, 1980  04/30/2019  [name] was observed post Covid-19 immunization for 15 minutes without incident. She was provided with Vaccine Information Sheet and instruction to access the V-Safe system.   [name] was instructed to call 911 with any severe reactions post vaccine: Marland Kitchen Difficulty breathing  . Swelling of face and throat  . A fast heartbeat  . A bad rash all over body  . Dizziness and weakness   Immunizations Administered    Name Date Dose VIS Date Route   Pfizer COVID-19 Vaccine 04/30/2019  2:24 PM 0.3 mL 01/03/2019 Intramuscular   Manufacturer: [employer], [employer]   Lot: [id number]   NDC: [address]-5

## 2019-06-12 NOTE — Progress Notes (Signed)
39 y.o. [id number] Married Caucasian female here for annual exam.    Celexa is working well.  Taking it for 2 weeks a month, sometimes less than this.   Her father had a PE and mother had severe bleeding from a hemorrhoid. Her father tested positive for a blood dyscrasia.   She did not see her PCP for cholesterol recheck.  She is trying to make healthy changes.   PCP:  Sigmund Hazel, MD   Patient's last menstrual period was 06/07/2019 (exact date).           Sexually active: Yes.    The current method of family planning is Bil.salpingectomy.    Exercising: No.  The patient does not participate in regular exercise at present. Smoker:  no  Health Maintenance: Pap: 05-12-16 Neg:Neg Hr HPV, 06-21-12 Neg:Neg HR HPV History of abnormal Pap:  no MMG: 2-26-21Diag.Bil.w/Bil.Us/Neg/density C/BiRads1 Colonoscopy:  n/a BMD:   n/a  Result  n/a TDaP:  06-14-18 Gardasil:   Yes, completed HIV: Neg in preg Hep C:n/a Screening Labs:  Today.    reports that she has never smoked. She has never used smokeless tobacco. She reports current alcohol use of about 4.0 standard drinks of alcohol per week. She reports that she does not use drugs.  Past Medical History:  Diagnosis Date  . Anxiety    no med  . Elevated LDL cholesterol level   . Family history of ovarian cancer   . Family history of uterine cancer   . 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 Low vitamin D level   . Migraines    menstrual  . SVD (spontaneous vaginal delivery)    x 2    Past Surgical History:  Procedure Laterality Date  . ABLATION ON ENDOMETRIOSIS N/A 08/11/2013   Procedure: Elenor Quinones OF ENDOMETRIOSIS, PERITONEAL BIOPSY;  Surgeon: Forrestine Him Amundson de Gwenevere Ghazi, MD;  Location: [facility];  Service: Gynecology;  Laterality: N/A;  . CHOLECYSTECTOMY    . DILATION AND CURETTAGE OF UTERUS     x2  . LAPAROSCOPIC BILATERAL SALPINGECTOMY Bilateral 08/11/2013   Procedure: LAPAROSCOPIC  BILATERAL SALPINGECTOMY;  Surgeon: Forrestine Him Amundson de Gwenevere Ghazi, MD;  Location: [facility];  Service: Gynecology;  Laterality: Bilateral;    Current Outpatient Medications  Medication Sig Dispense Refill  . citalopram (CELEXA) 20 MG tablet TAKE 1 TABLET BY MOUTH EVERY DAY FOR 2 WEEKS PER MONTH 45 tablet 0  . diphenhydrAMINE (SOMINEX) 25 MG tablet Take 25 mg by mouth at bedtime as needed for sleep.    . Vitamin D, Ergocalciferol, (DRISDOL) 1.25 MG (50000 UT) CAPS capsule Take 1 capsule (50,000 Units total) by mouth every 7 (seven) days. 12 capsule 3   No current facility-administered medications for this visit.    Family History  Problem Relation Age of Onset  . Hypertension Mother   . Hypertension Paternal Grandmother   . Ovarian cancer Paternal Grandmother 32  . Cancer Paternal Grandmother 63       Ovarian  . Leukemia Father   . Pulmonary embolism Father   . Ovarian cancer Maternal Aunt 80       deceased age 47  . Breast cancer Maternal Aunt 42  . Uterine cancer Maternal Aunt        dx in her 34s  . Diabetes Paternal Uncle   . Diabetes Maternal Grandmother   . Breast cancer Maternal Aunt 26  deceased age 66  . Cancer Maternal Aunt 51       ?endometrial ca  . Stroke Paternal Grandfather   . Cancer Maternal Uncle        ocular cancer - melanoma  . Breast cancer Paternal Aunt     Review of Systems  All other systems reviewed and are negative.   Exam:   BP 116/68 (Cuff Size: Large)   Pulse 70   Temp (!) 97.3 F (36.3 C) (Temporal)   Resp 16   Ht 5\' 6"  (1.676 m)   Wt 173 lb 3.2 oz (78.6 kg)   LMP 06/07/2019 (Exact Date)   BMI 27.96 kg/m     General appearance: alert, cooperative and appears stated age Head: normocephalic, without obvious abnormality, atraumatic Neck: no adenopathy, supple, symmetrical, trachea midline and thyroid normal to inspection and palpation Lungs: clear to auscultation bilaterally Breasts: normal appearance, no masses or  tenderness, No nipple retraction or dimpling, No nipple discharge or bleeding, No axillary adenopathy Heart: regular rate and rhythm Abdomen: soft, non-tender; no masses, no organomegaly Extremities: extremities normal, atraumatic, no cyanosis or edema Skin: skin color, texture, turgor normal. No rashes or lesions Lymph nodes: cervical, supraclavicular, and axillary nodes normal. Neurologic: grossly normal  Pelvic: External genitalia:  no lesions              No abnormal inguinal nodes palpated.              Urethra:  normal appearing urethra with no masses, tenderness or lesions              Bartholins and Skenes: normal                 Vagina: normal appearing vagina with normal color and discharge, no lesions              Cervix: no lesions              Pap taken: No. Bimanual Exam:  Uterus:  normal size, contour, position, consistency, mobility, non-tender              Adnexa: no mass, fullness, tenderness              Rectal exam: Yes.  .  Confirms.              Anus:  normal sphincter tone, no lesions.  Hemorrhoid noted.   Chaperone was present for exam.  Assessment:   Well woman visit with normal exam. Status post bilateral salpingectomy and tx of endometriosis. Menstrual migraines.  FH breast, uterine, ovarian cancer. Increased risk of melanoma and kidney cancer by genetic testing.  PMDD. On Celexa.  GSI.  Low vit D. FH of PE.  Underlying blood disorder?  Plan: Mammogram screening discussed. Self breast awareness reviewed. Pap and HR HPV as above. Guidelines for Calcium, Vitamin D, regular exercise program including cardiovascular and weight bearing exercise. Routine labs.  Refill of Celexa.  Follow up annually and prn.   After visit summary provided.

## 2019-06-16 ENCOUNTER — Ambulatory Visit: Payer: 59 | Admitting: Obstetrics and Gynecology

## 2019-06-16 ENCOUNTER — Other Ambulatory Visit: Payer: Self-pay

## 2019-06-16 ENCOUNTER — Encounter: Payer: Self-pay | Admitting: Obstetrics and Gynecology

## 2019-06-16 VITALS — BP 116/68 | HR 70 | Temp 97.3°F | Resp 16 | Ht 66.0 in | Wt 173.2 lb

## 2019-06-16 DIAGNOSIS — Z01419 Encounter for gynecological examination (general) (routine) without abnormal findings: Secondary | ICD-10-CM | POA: Diagnosis not present

## 2019-06-16 MED ORDER — CITALOPRAM HYDROBROMIDE 20 MG PO TABS: ORAL_TABLET | ORAL | 3 refills | Status: DC

## 2019-06-16 NOTE — Patient Instructions (Signed)

## 2019-06-17 LAB — CBC
Hematocrit: 40.6 % (ref 34.0–46.6)
Hemoglobin: 13.2 g/dL (ref 11.1–15.9)
MCH: 28 pg (ref 26.6–33.0)
MCHC: 32.5 g/dL (ref 31.5–35.7)
MCV: 86 fL (ref 79–97)
Platelets: 261 10*3/uL (ref 150–450)
RBC: 4.71 x10E6/uL (ref 3.77–5.28)
RDW: 12 % (ref 11.7–15.4)
WBC: 5.3 10*3/uL (ref 3.4–10.8)

## 2019-06-17 LAB — COMPREHENSIVE METABOLIC PANEL
ALT: 19 IU/L (ref 0–32)
AST: 23 IU/L (ref 0–40)
Albumin/Globulin Ratio: 1.9 (ref 1.2–2.2)
Albumin: 5 g/dL — ABNORMAL HIGH (ref 3.8–4.8)
Alkaline Phosphatase: 87 IU/L (ref 48–121)
BUN/Creatinine Ratio: 16 (ref 9–23)
BUN: 11 mg/dL (ref 6–24)
Bilirubin Total: 0.3 mg/dL (ref 0.0–1.2)
CO2: 24 mmol/L (ref 20–29)
Calcium: 9.6 mg/dL (ref 8.7–10.2)
Chloride: 101 mmol/L (ref 96–106)
Creatinine, Ser: 0.7 mg/dL (ref 0.57–1.00)
GFR calc Af Amer: 125 mL/min/{1.73_m2} (ref >59)
GFR calc non Af Amer: 109 mL/min/{1.73_m2} (ref >59)
Globulin, Total: 2.6 g/dL (ref 1.5–4.5)
Glucose: 90 mg/dL (ref 65–99)
Potassium: 4.2 mmol/L (ref 3.5–5.2)
Sodium: 139 mmol/L (ref 134–144)
Total Protein: 7.6 g/dL (ref 6.0–8.5)

## 2019-06-17 LAB — LIPID PANEL
Chol/HDL Ratio: 3.9 ratio (ref 0.0–4.4)
Cholesterol, Total: 198 mg/dL (ref 100–199)
HDL: 51 mg/dL (ref >39)
LDL Chol Calc (NIH): 128 mg/dL — ABNORMAL HIGH (ref 0–99)
Triglycerides: 105 mg/dL (ref 0–149)
VLDL Cholesterol Cal: 19 mg/dL (ref 5–40)

## 2019-06-17 LAB — VITAMIN D 25 HYDROXY (VIT D DEFICIENCY, FRACTURES): Vit D, 25-Hydroxy: 31.1 ng/mL (ref 30.0–100.0)

## 2020-04-20 ENCOUNTER — Other Ambulatory Visit: Payer: Self-pay | Admitting: Obstetrics and Gynecology

## 2020-04-20 DIAGNOSIS — Z1231 Encounter for screening mammogram for malignant neoplasm of breast: Secondary | ICD-10-CM

## 2020-05-01 ENCOUNTER — Other Ambulatory Visit: Payer: Self-pay

## 2020-05-01 ENCOUNTER — Ambulatory Visit
Admission: RE | Admit: 2020-05-01 | Discharge: 2020-05-01 | Disposition: A | Discharge status: Home / Self Care | Payer: 59 | Source: Ambulatory Visit | Attending: Obstetrics and Gynecology | Admitting: Obstetrics and Gynecology

## 2020-05-01 DIAGNOSIS — Z1231 Encounter for screening mammogram for malignant neoplasm of breast: Secondary | ICD-10-CM

## 2020-06-15 NOTE — Progress Notes (Signed)
40 y.o. [id number] Married Caucasian female here for annual exam.   Patient having 2 cycles a month x 5 months, weight gain, and hot flashes. Having a lot of sweating.   Having a full cycle every 17 days and heavy bleeding.  First two days, she changes her pad constantly.  Having PMS symptoms.  Took birth control pills in the past and did not have any complications.   Bought a treadmill and lost 30 pounds.  Taking Celexa for 2.5 weeks per month.  She thinks it is helpful to her.  Has decreased appetite on the Celexa. Feeling stressed.   Leakage with cough, sneeze.  Does Kegels and this is not helping.   Would like to loose weight.   PCP: Sigmund Hazel, MD    Patient's last menstrual period was 05/26/2020 (exact date).     Period Cycle (Days): 14 Period Duration (Days): 5-7 days Period Pattern: (!) Irregular (Irregular for past 5 months) Menstrual Flow: Moderate Menstrual Control: Maxi pad Dysmenorrhea: (!) Moderate Dysmenorrhea Symptoms: Cramping,Headache     Sexually active: Yes.    The current method of family planning is Bil.salpinfectomy.    Exercising: Yes.    treadmill Smoker:  no  Health Maintenance: Pap: 05-12-16 Neg:Neg Hr HPV, 06-21-12 Neg:Neg HR HPV History of abnormal Pap:  no MMG: 05-01-20 3D/Neg/BiRads1 Colonoscopy:  n/a BMD: n/a  Result  n/a TDaP:  06-14-18 Gardasil:   Yes, completed HIV: Neg in preg Hep C: n/a Screening Labs:   Will defer to [facility].   reports that she has never smoked. She has never used smokeless tobacco. She reports current alcohol use of about 4.0 standard drinks of alcohol per week. She reports that she does not use drugs.  Past Medical History:  Diagnosis Date  . Anxiety    no med  . Elevated LDL cholesterol level   . Family history of ovarian cancer   . Family history of uterine cancer   . 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 Low vitamin D level   . Migraines     menstrual  . SVD (spontaneous vaginal delivery)    x 2    Past Surgical History:  Procedure Laterality Date  . ABLATION ON ENDOMETRIOSIS N/A 08/11/2013   Procedure: Elenor Quinones OF ENDOMETRIOSIS, PERITONEAL BIOPSY;  Surgeon: Forrestine Him Amundson de Gwenevere Ghazi, MD;  Location: [facility];  Service: Gynecology;  Laterality: N/A;  . CHOLECYSTECTOMY    . DILATION AND CURETTAGE OF UTERUS     x2  . LAPAROSCOPIC BILATERAL SALPINGECTOMY Bilateral 08/11/2013   Procedure: LAPAROSCOPIC BILATERAL SALPINGECTOMY;  Surgeon: Forrestine Him Amundson de Gwenevere Ghazi, MD;  Location: [facility];  Service: Gynecology;  Laterality: Bilateral;    Current Outpatient Medications  Medication Sig Dispense Refill  . citalopram (CELEXA) 20 MG tablet Take one tablet (20 mg) by mouth for 2 weeks per month as needed. 45 tablet 3  . diphenhydrAMINE (SOMINEX) 25 MG tablet Take 25 mg by mouth at bedtime as needed for sleep.     No current facility-administered medications for this visit.    Family History  Problem Relation Age of Onset  . Hypertension Mother   . Hypertension Paternal Grandmother   . Ovarian cancer Paternal Grandmother 4  . Cancer Paternal Grandmother 62       Ovarian  . Leukemia Father   . Pulmonary embolism Father   . Ovarian cancer Maternal Aunt 92  deceased age 45  . Breast cancer Maternal Aunt 42  . Uterine cancer Maternal Aunt        dx in her 31s  . Diabetes Paternal Uncle   . Diabetes Maternal Grandmother   . Breast cancer Maternal Aunt 68       deceased age 54  . Cancer Maternal Aunt 51       ?endometrial ca  . Stroke Paternal Grandfather   . Cancer Maternal Uncle        ocular cancer - melanoma  . Breast cancer Paternal Aunt     Review of Systems  Constitutional: Positive for unexpected weight change (increased weight gain).  Genitourinary:       SUI  All other systems reviewed and are negative.   Exam:   BP 122/78 (Cuff Size: Large)   Pulse 84   Ht 5\' 5"  (1.651 m)   Wt  199 lb (90.3 kg)   LMP 05/26/2020 (Exact Date)   SpO2 100%   BMI 33.12 kg/m     General appearance: alert, cooperative and appears stated age Head: normocephalic, without obvious abnormality, atraumatic Neck: no adenopathy, supple, symmetrical, trachea midline and thyroid irregular bilaterally. Lungs: clear to auscultation bilaterally Breasts: normal appearance, no masses or tenderness, No nipple retraction or dimpling, No nipple discharge or bleeding, No axillary adenopathy Heart: regular rate and rhythm Abdomen: soft, non-tender; no masses, no organomegaly Extremities: extremities normal, atraumatic, no cyanosis or edema Skin: skin color, texture, turgor normal. No rashes or lesions Lymph nodes: cervical, supraclavicular, and axillary nodes normal. Neurologic: grossly normal  Pelvic: External genitalia:  no lesions              No abnormal inguinal nodes palpated.              Urethra:  normal appearing urethra with no masses, tenderness or lesions              Bartholins and Skenes: normal                 Vagina: normal appearing vagina with normal color and discharge, no lesions              Cervix: no lesions              Pap taken: No. Bimanual Exam:  Uterus:  normal size, contour, position, consistency, mobility, non-tender              Adnexa: no mass, fullness, tenderness              Rectal exam: Yes.  .  Confirms.              Anus:  normal sphincter tone, no lesions  Chaperone was present for exam.  Assessment:   Well woman visit with normal exam. Status post bilateral salpingectomy and tx of endometriosis. Frequent menses.  Menstrual migraines.  No aura.  Menopausal symptoms.  FH breast, uterine, ovarian cancer. Increased risk of melanoma and kidney cancer by genetic testing.  PMDD. On Celexa.  Weight gain.  BMI 33.12. Thyroid enlargement with irregularity.  Stress incontinence.  Father with PE and rare blood dyscrasia.   Plan: Mammogram screening  discussed. Self breast awareness reviewed. Pap and HR HPV as above. Guidelines for Calcium, Vitamin D, regular exercise program including cardiovascular and weight bearing exercise. Start Mircette.  I discussed potential risk of stroke, PE, DVT, and MI. Warning signs reviewed.  Take Celexa daily.  Referral to [facility].  Thyroid ultrasound.  Check TSH and free T4 today. For her stress incontinence, she will focus on weight loss for now.  I did explain urodynamic testing and midurethral sling for surgical treatment of stress incontinence if desired.  Fu in 3 months.  Follow up annually and prn.

## 2020-06-16 ENCOUNTER — Encounter: Payer: Self-pay | Admitting: Obstetrics and Gynecology

## 2020-06-16 ENCOUNTER — Ambulatory Visit (INDEPENDENT_AMBULATORY_CARE_PROVIDER_SITE_OTHER): Payer: 59 | Admitting: Obstetrics and Gynecology

## 2020-06-16 ENCOUNTER — Telehealth: Payer: Self-pay | Admitting: Obstetrics and Gynecology

## 2020-06-16 ENCOUNTER — Other Ambulatory Visit: Payer: Self-pay

## 2020-06-16 VITALS — BP 122/78 | HR 84 | Ht 65.0 in | Wt 199.0 lb

## 2020-06-16 DIAGNOSIS — Z01419 Encounter for gynecological examination (general) (routine) without abnormal findings: Secondary | ICD-10-CM

## 2020-06-16 DIAGNOSIS — Z6833 Body mass index (BMI) 33.0-33.9, adult: Secondary | ICD-10-CM | POA: Diagnosis not present

## 2020-06-16 DIAGNOSIS — E049 Nontoxic goiter, unspecified: Secondary | ICD-10-CM

## 2020-06-16 LAB — T4, FREE: Free T4: 1 ng/dL (ref 0.8–1.8)

## 2020-06-16 LAB — TSH: TSH: 3.36 mIU/L

## 2020-06-16 MED ORDER — CITALOPRAM HYDROBROMIDE 20 MG PO TABS: ORAL_TABLET | ORAL | 3 refills | Status: DC

## 2020-06-16 MED ORDER — DESOGESTREL-ETHINYL ESTRADIOL 0.15-0.02/0.01 MG (21/5) PO TABS: 1.0000 | ORAL_TABLET | Freq: Every day | ORAL | 3 refills | Status: DC

## 2020-06-16 NOTE — Patient Instructions (Addendum)

## 2020-06-16 NOTE — Telephone Encounter (Signed)
Please schedule thyroid ultrasound for patient.  She has an enlarged and irregular thyroid gland.   I have placed an order.

## 2020-06-18 NOTE — Telephone Encounter (Signed)
Patient aware she can call to schedule number given to [facility] [phone].

## 2020-06-18 NOTE — Telephone Encounter (Signed)
Left message for patient to call.

## 2020-06-18 NOTE — Telephone Encounter (Signed)
Patient returning call. Wants a detailed message and phone number left so she can schedule her appointment.

## 2020-06-25 NOTE — Telephone Encounter (Signed)
Thyroid ultrasound scheduled on 07/01/20

## 2020-07-01 ENCOUNTER — Ambulatory Visit
Admission: RE | Admit: 2020-07-01 | Discharge: 2020-07-01 | Disposition: A | Discharge status: Home / Self Care | Payer: 59 | Source: Ambulatory Visit | Attending: Obstetrics and Gynecology | Admitting: Obstetrics and Gynecology

## 2020-07-01 DIAGNOSIS — E049 Nontoxic goiter, unspecified: Secondary | ICD-10-CM

## 2020-08-06 ENCOUNTER — Other Ambulatory Visit: Payer: Self-pay | Admitting: Obstetrics and Gynecology

## 2020-09-13 ENCOUNTER — Ambulatory Visit: Payer: 59 | Admitting: Obstetrics and Gynecology

## 2020-09-16 ENCOUNTER — Encounter: Payer: Self-pay | Admitting: Obstetrics and Gynecology

## 2020-09-16 ENCOUNTER — Other Ambulatory Visit: Payer: Self-pay

## 2020-09-16 ENCOUNTER — Ambulatory Visit: Payer: 59 | Admitting: Obstetrics and Gynecology

## 2020-09-16 VITALS — BP 122/80 | HR 96 | Ht 65.5 in | Wt 189.0 lb

## 2020-09-16 DIAGNOSIS — F3281 Premenstrual dysphoric disorder: Secondary | ICD-10-CM | POA: Diagnosis not present

## 2020-09-16 DIAGNOSIS — Z5181 Encounter for therapeutic drug level monitoring: Secondary | ICD-10-CM

## 2020-09-16 DIAGNOSIS — N926 Irregular menstruation, unspecified: Secondary | ICD-10-CM | POA: Diagnosis not present

## 2020-09-16 MED ORDER — CITALOPRAM HYDROBROMIDE 20 MG PO TABS: ORAL_TABLET | ORAL | 1 refills | Status: DC

## 2020-09-16 NOTE — Progress Notes (Signed)
GYNECOLOGY  VISIT   HPI: 40 y.o.   Married  Caucasian  female   [phone] with Patient's last menstrual period was 09/09/2020 (exact date).   here for 3 month medication follow up.   Not taking Celexa daily.  She tried and had decreased sex drive.  Now she is taking just 2 weeks per month for PMDD.   Taking birth control and having menses just once a month and not twice.  Feels so much better, more even.  Cycles are heavy but short.  She has some hot flashes during her period week.  No significant sweating.  Has occasional menstrual headaches, but none in this last cycle.   Energy is better.   Lost 10 pounds.  She is not pursuing healthy weight management consultation as she is currently doing this on her own.   Will start working for [employer].   GYNECOLOGIC HISTORY: Patient's last menstrual period was 09/09/2020 (exact date). Contraception:  Bil.salpingectomy/Mircette Menopausal hormone therapy:  none Last mammogram:  05-01-20 3D/Neg/BiRads1 Last pap smear:  05-12-16 Neg:Neg Hr HPV, 06-21-12 Neg:Neg HR HPV        OB History     Gravida  5   Para      Term      Preterm      AB  3   Living  2      SAB  2   IAB  1   Ectopic      Multiple      Live Births                 Patient Active Problem List   Diagnosis Date Noted   Genetic testing 06/29/2016   Family history of uterine cancer    Family history of ovarian cancer    LLQ pain 09/01/2013   Abdominal bloating 09/01/2013    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   Low vitamin D level    Migraines    menstrual   SVD (spontaneous vaginal delivery)    x 2    Past Surgical History:  Procedure Laterality Date   ABLATION ON ENDOMETRIOSIS N/A 08/11/2013   Procedure: FULGERATION OF  ENDOMETRIOSIS, PERITONEAL BIOPSY;  Surgeon: Forrestine Him Amundson de Gwenevere Ghazi, MD;  Location: [facility];  Service: Gynecology;  Laterality: N/A;   CHOLECYSTECTOMY     DILATION AND CURETTAGE OF UTERUS     x2   LAPAROSCOPIC BILATERAL SALPINGECTOMY Bilateral 08/11/2013   Procedure: LAPAROSCOPIC BILATERAL SALPINGECTOMY;  Surgeon: Forrestine Him Amundson de Gwenevere Ghazi, MD;  Location: [facility];  Service: Gynecology;  Laterality: Bilateral;    Current Outpatient Medications  Medication Sig Dispense Refill   citalopram (CELEXA) 20 MG tablet Take one tablet (20 mg) by mouth daily. 90 tablet 3   desogestrel-ethinyl estradiol (MIRCETTE) 0.15-0.02/0.01 MG (21/5) tablet Take 1 tablet by mouth daily. 84 tablet 3   diphenhydrAMINE (SOMINEX) 25 MG tablet Take 25 mg by mouth at bedtime as needed for sleep.     No current facility-administered medications for this visit.     ALLERGIES: Topiramate  Family History  Problem Relation Age of Onset   Hypertension Mother    Hypertension Paternal Grandmother    Ovarian cancer  Paternal Grandmother 44   Cancer Paternal Grandmother 46       Ovarian   Leukemia Father    Pulmonary embolism Father    Ovarian cancer Maternal Aunt 52       deceased age 35   Breast cancer Maternal Aunt 67   Uterine cancer Maternal Aunt        dx in her 26s   Diabetes Paternal Uncle    Diabetes Maternal Grandmother    Breast cancer Maternal Aunt 81       deceased age 40   Cancer Maternal Aunt 6       ?endometrial ca   Stroke Paternal Grandfather    Cancer Maternal Uncle        ocular cancer - melanoma   Breast cancer Paternal Aunt     Social History   Socioeconomic History   Marital status: Married    Spouse name: Not on file   Number of children: Not on file   Years of education: Not on file   Highest education level: Not on file  Occupational History   Not on file  Tobacco Use   Smoking status: Never   Smokeless tobacco: Never  Vaping Use   Vaping Use: Never  used  Substance and Sexual Activity   Alcohol use: Yes    Alcohol/week: 4.0 standard drinks    Types: 2 Glasses of wine, 2 Standard drinks or equivalent per week    Comment: socially   Drug use: No   Sexual activity: Yes    Birth control/protection: Surgical    Comment: Bilateral Salpingectomy  Other Topics Concern   Not on file  Social History Narrative   Not on file   Social Determinants of Health   Financial Resource Strain: Not on file  Food Insecurity: Not on file  Transportation Needs: Not on file  Physical Activity: Not on file  Stress: Not on file  Social Connections: Not on file  Intimate Partner Violence: Not on file    Review of Systems  All other systems reviewed and are negative.  PHYSICAL EXAMINATION:    BP 122/80   Pulse 96   Ht 5' 5.5" (1.664 m)   Wt 189 lb (85.7 kg)   LMP 09/09/2020 (Exact Date)   SpO2 98%   BMI 30.97 kg/m     General appearance: alert, cooperative and appears stated age  ASSESSMENT  Irregular menses, resolved on birth control pills.  PMDD.  Encounter for medication monitoring.   PLAN  Continue combined oral contraception.  Continue Celexa for 2 weeks pre menses.  FU for annual exam and prn.    An After Visit Summary was printed and given to the patient.

## 2021-04-11 ENCOUNTER — Other Ambulatory Visit: Payer: Self-pay | Admitting: Obstetrics and Gynecology

## 2021-04-11 DIAGNOSIS — Z1231 Encounter for screening mammogram for malignant neoplasm of breast: Secondary | ICD-10-CM

## 2021-05-02 ENCOUNTER — Ambulatory Visit
Admission: RE | Admit: 2021-05-02 | Discharge: 2021-05-02 | Disposition: A | Discharge status: Home / Self Care | Payer: 59 | Source: Ambulatory Visit | Attending: Obstetrics and Gynecology | Admitting: Obstetrics and Gynecology

## 2021-05-02 DIAGNOSIS — Z1231 Encounter for screening mammogram for malignant neoplasm of breast: Secondary | ICD-10-CM

## 2021-05-13 ENCOUNTER — Encounter: Payer: Self-pay | Admitting: Obstetrics and Gynecology

## 2021-05-13 DIAGNOSIS — N926 Irregular menstruation, unspecified: Secondary | ICD-10-CM

## 2021-05-13 MED ORDER — DESOGESTREL-ETHINYL ESTRADIOL 0.15-0.02/0.01 MG (21/5) PO TABS: 1.0000 | ORAL_TABLET | Freq: Every day | ORAL | 1 refills | Status: DC

## 2021-05-13 NOTE — Telephone Encounter (Signed)
Last AEX 06/16/20--scheduled on 06/21/21. ?Last mammo 05/02/21-birads 1 ?

## 2021-05-19 ENCOUNTER — Other Ambulatory Visit: Payer: Self-pay | Admitting: Obstetrics and Gynecology

## 2021-05-19 DIAGNOSIS — N926 Irregular menstruation, unspecified: Secondary | ICD-10-CM

## 2021-06-21 ENCOUNTER — Ambulatory Visit (INDEPENDENT_AMBULATORY_CARE_PROVIDER_SITE_OTHER): Payer: 59 | Admitting: Obstetrics and Gynecology

## 2021-06-21 ENCOUNTER — Encounter: Payer: Self-pay | Admitting: Obstetrics and Gynecology

## 2021-06-21 ENCOUNTER — Other Ambulatory Visit (HOSPITAL_COMMUNITY)
Admission: RE | Admit: 2021-06-21 | Discharge: 2021-06-21 | Disposition: A | Discharge status: Home / Self Care | Payer: 59 | Source: Ambulatory Visit | Attending: Obstetrics and Gynecology | Admitting: Obstetrics and Gynecology

## 2021-06-21 VITALS — BP 128/74 | HR 88 | Ht 66.0 in | Wt 187.0 lb

## 2021-06-21 DIAGNOSIS — Z124 Encounter for screening for malignant neoplasm of cervix: Secondary | ICD-10-CM | POA: Insufficient documentation

## 2021-06-21 DIAGNOSIS — Z01419 Encounter for gynecological examination (general) (routine) without abnormal findings: Secondary | ICD-10-CM | POA: Diagnosis not present

## 2021-06-21 NOTE — Patient Instructions (Signed)
EXERCISE AND DIET:  We recommended that you start or continue a regular exercise program for good health. Regular exercise means any activity that makes your heart beat faster and makes you sweat.  We recommend exercising at least 30 minutes per day at least 3 days a week, preferably 4 or 5.  We also recommend a diet low in fat and sugar.  Inactivity, poor dietary choices and obesity can cause diabetes, heart attack, stroke, and kidney damage, among others.    ALCOHOL AND SMOKING:  Women should limit their alcohol intake to no more than 7 drinks/beers/glasses of wine (combined, not each!) per week. Moderation of alcohol intake to this level decreases your risk of breast cancer and liver damage. And of course, no recreational drugs are part of a healthy lifestyle.  And absolutely no smoking or even second hand smoke. Most people know smoking can cause heart and lung diseases, but did you know it also contributes to weakening of your bones? Aging of your skin?  Yellowing of your teeth and nails?  CALCIUM AND VITAMIN D:  Adequate intake of calcium and Vitamin D are recommended.  The recommendations for exact amounts of these supplements seem to change often, but generally speaking 600 mg of calcium (either carbonate or citrate) and 800 units of Vitamin D per day seems prudent. Certain women may benefit from higher intake of Vitamin D.  If you are among these women, your doctor will have told you during your visit.    PAP SMEARS:  Pap smears, to check for cervical cancer or precancers,  have traditionally been done yearly, although recent scientific advances have shown that most women can have pap smears less often.  However, every woman still should have a physical exam from her gynecologist every year. It will include a breast check, inspection of the vulva and vagina to check for abnormal growths or skin changes, a visual exam of the cervix, and then an exam to evaluate the size and shape of the uterus and  ovaries.  And after 42 years of age, a rectal exam is indicated to check for rectal cancers. We will also provide age appropriate advice regarding health maintenance, like when you should have certain vaccines, screening for sexually transmitted diseases, bone density testing, colonoscopy, mammograms, etc.   MAMMOGRAMS:  All women over 40 years old should have a yearly mammogram. Many facilities now offer a "3D" mammogram, which may cost around $50 extra out of pocket. If possible,  we recommend you accept the option to have the 3D mammogram performed.  It both reduces the number of women who will be called back for extra views which then turn out to be normal, and it is better than the routine mammogram at detecting truly abnormal areas.    COLONOSCOPY:  Colonoscopy to screen for colon cancer is recommended for all women at age 50.  We know, you hate the idea of the prep.  We agree, BUT, having colon cancer and not knowing it is worse!!  Colon cancer so often starts as a polyp that can be seen and removed at colonscopy, which can quite literally save your life!  And if your first colonoscopy is normal and you have no family history of colon cancer, most women don't have to have it again for 10 years.  Once every ten years, you can do something that may end up saving your life, right?  We will be happy to help you get it scheduled when you are ready.    Be sure to check your insurance coverage so you understand how much it will cost.  It may be covered as a preventative service at no cost, but you should check your particular policy.    Calcium Content in Foods Calcium is the most abundant mineral in the body. Most of the body's calcium supply is stored in bones and teeth. Calcium helps many parts of the body function normally, including: Blood and blood vessels. Nerves. Hormones. Muscles. Bones and teeth. When your calcium stores are low, you may be at risk for low bone mass, bone loss, and broken bones  (fractures). When you get enough calcium, it helps to support strong bones and teeth throughout your life. Calcium is especially important for: Children during growth spurts. Girls during adolescence. Women who are pregnant or breastfeeding. Women after their menstrual cycle stops (postmenopause). Women whose menstrual cycle has stopped due to anorexia nervosa or regular intense exercise. People who cannot eat or digest dairy products. Vegans. Recommended daily amounts of calcium: Women (ages 19 to 50): 1,000 mg per day. Women (ages 51 and older): 1,200 mg per day. Men (ages 19 to 70): 1,000 mg per day. Men (ages 71 and older): 1,200 mg per day. Women (ages 9 to 18): 1,300 mg per day. Men (ages 9 to 18): 1,300 mg per day. General information Eat foods that are high in calcium. Try to get most of your calcium from food. Some people may benefit from taking calcium supplements. Check with your health care provider or diet and nutrition specialist (dietitian) before starting any calcium supplements. Calcium supplements may interact with certain medicines. Too much calcium may cause other health problems, such as constipation and kidney stones. For the body to absorb calcium, it needs vitamin D. Sources of vitamin D include: Skin exposure to direct sunlight. Foods, such as egg yolks, liver, mushrooms, saltwater fish, and fortified milk. Vitamin D supplements. Check with your health care provider or dietitian before starting any vitamin D supplements. What foods are high in calcium?  Foods that are high in calcium contain more than 100 milligrams per serving. Fruits Fortified orange juice or other fruit juice, 300 mg per 8 oz serving. Vegetables Collard greens, 360 mg per 8 oz serving. Kale, 100 mg per 8 oz serving. Bok choy, 160 mg per 8 oz serving. Grains Fortified ready-to-eat cereals, 100 to 1,000 mg per 8 oz serving. Fortified frozen waffles, 200 mg in 2 waffles. Oatmeal, 140 mg in  1 cup. Meats and other proteins Sardines, canned with bones, 325 mg per 3 oz serving. Salmon, canned with bones, 180 mg per 3 oz serving. Canned shrimp, 125 mg per 3 oz serving. Baked beans, 160 mg per 4 oz serving. Tofu, firm, made with calcium sulfate, 253 mg per 4 oz serving. Dairy Yogurt, plain, low-fat, 310 mg per 6 oz serving. Nonfat milk, 300 mg per 8 oz serving. American cheese, 195 mg per 1 oz serving. Cheddar cheese, 205 mg per 1 oz serving. Cottage cheese 2%, 105 mg per 4 oz serving. Fortified soy, rice, or almond milk, 300 mg per 8 oz serving. Mozzarella, part skim, 210 mg per 1 oz serving. The items listed above may not be a complete list of foods high in calcium. Actual amounts of calcium may be different depending on processing. Contact a dietitian for more information. What foods are lower in calcium? Foods that are lower in calcium contain 50 mg or less per serving. Fruits Apple, about 6 mg. Banana, about 12 mg.   Vegetables Lettuce, 19 mg per 2 oz serving. Tomato, about 11 mg. Grains Rice, 4 mg per 6 oz serving. Boiled potatoes, 14 mg per 8 oz serving. White bread, 6 mg per slice. Meats and other proteins Egg, 27 mg per 2 oz serving. Red meat, 7 mg per 4 oz serving. Chicken, 17 mg per 4 oz serving. Fish, cod, or trout, 20 mg per 4 oz serving. Dairy Cream cheese, regular, 14 mg per 1 Tbsp serving. Brie cheese, 50 mg per 1 oz serving. Parmesan cheese, 70 mg per 1 Tbsp serving. The items listed above may not be a complete list of foods lower in calcium. Actual amounts of calcium may be different depending on processing. Contact a dietitian for more information. Summary Calcium is an important mineral in the body because it affects many functions. Getting enough calcium helps support strong bones and teeth throughout your life. Try to get most of your calcium from food. Calcium supplements may interact with certain medicines. Check with your health care provider  or dietitian before starting any calcium supplements. This information is not intended to replace advice given to you by your health care provider. Make sure you discuss any questions you have with your health care provider. Document Revised: 05/07/2019 Document Reviewed: 05/07/2019 Elsevier Patient Education  2023 Elsevier Inc.  

## 2021-06-21 NOTE — Progress Notes (Unsigned)
41 y.o. [id number] Married Caucasian female here for annual exam.    Patient seeing MD at [facility] for menopausal symptoms. Had decreased energy, hot flashes, and decreased libido.  She is taking thyroid medication.   She stopped her birth control pills and on HRT for one month to treat her perimenopausal symptoms.  Using Vivelle Dot and taking Prometrium nightly.  She is using nightly testosterone.  Sleeping well now and hot flashes are controlled.  Her menses were becoming short prior to initiating her HRT.  Off Celexa.  Her mood is now stabilized.   Taking vit D and magnesium.   Kids are doing well.   PCP:  Sigmund Hazel, MD   Patient's last menstrual period was 05/22/2021 (exact date).     Period Pattern: (!) Irregular     Sexually active: Yes.    The current method of family planning is Bil.Salpingectomy.    Exercising: No.  The patient does not participate in regular exercise at present. Smoker:  no  Health Maintenance: Pap:   05-12-16 Neg:Neg Hr HPV, 06-21-12 Neg:Neg HR HPV History of abnormal Pap:  no MMG:  05-02-21 Neg/Birads1 Colonoscopy:  n/a BMD:   n/a  Result  n/a TDaP:  06-14-18 Gardasil:   yes, completed HIV: Neg in the past Hep C: no Screening Labs:  PCP   reports that she has never smoked. She has never used smokeless tobacco. She reports that she does not currently use alcohol. She reports that she does not use drugs.  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   Low vitamin D level    Migraines    menstrual   SVD (spontaneous vaginal delivery)    x 2    Past Surgical History:  Procedure Laterality Date   ABLATION ON ENDOMETRIOSIS N/A 08/11/2013   Procedure: FULGERATION OF ENDOMETRIOSIS, PERITONEAL BIOPSY;  Surgeon: Jacqualin Combes de Gwenevere Ghazi, MD;  Location: [facility];  Service: Gynecology;  Laterality: N/A;   CHOLECYSTECTOMY     DILATION AND CURETTAGE OF UTERUS     x2   LAPAROSCOPIC BILATERAL SALPINGECTOMY Bilateral 08/11/2013   Procedure: LAPAROSCOPIC BILATERAL SALPINGECTOMY;  Surgeon: Forrestine Him Amundson de Gwenevere Ghazi, MD;  Location: [facility];  Service: Gynecology;  Laterality: Bilateral;    Current Outpatient Medications  Medication Sig Dispense Refill   estradiol (VIVELLE-DOT) 0.025 MG/24HR 1 patch 2 (two) times a week.     levothyroxine (SYNTHROID) 25 MCG tablet Take 25 mcg by mouth daily.     liothyronine (CYTOMEL) 5 MCG tablet Take 5 mcg by mouth daily.     magnesium gluconate (MAGONATE) 500 MG tablet Take 500 mg by mouth at bedtime.     PRESCRIPTION MEDICATION Testosterone Cream Sig: 1 click behind knee qhs     progesterone (PROMETRIUM) 100 MG capsule Take 100 mg by mouth at bedtime.     Vitamin D-Vitamin K (VITAMIN K2-VITAMIN D3 PO)      citalopram (CELEXA) 20 MG tablet Take one tablet (20 mg) by mouth daily for 2 weeks per month. 42 tablet 1   No current facility-administered medications for this visit.    Family History  Problem Relation Age of Onset   Hypertension Mother  Hypertension Paternal Grandmother    Ovarian cancer Paternal Grandmother 18   Cancer Paternal Grandmother 84       Ovarian   Leukemia Father    Pulmonary embolism Father    Ovarian cancer Maternal Aunt 45       deceased age 63   Breast cancer Maternal Aunt 30   Uterine cancer Maternal Aunt        dx in her 20s   Diabetes Paternal Uncle    Diabetes Maternal Grandmother    Breast cancer Maternal Aunt 58       deceased age 37   Cancer Maternal Aunt 39       ?endometrial ca   Stroke Paternal Grandfather    Cancer Maternal Uncle        ocular cancer - melanoma   Breast cancer Paternal Aunt     Review of Systems  All other systems reviewed and are negative.  Exam:   BP 128/74   Pulse 88   Ht 5\' 6"  (1.676 m)   Wt 187 lb  (84.8 kg)   LMP 05/22/2021 (Exact Date)   SpO2 98%   BMI 30.18 kg/m     General appearance: alert, cooperative and appears stated age Head: normocephalic, without obvious abnormality, atraumatic Neck: no adenopathy, supple, symmetrical, trachea midline and thyroid normal to inspection and palpation Lungs: clear to auscultation bilaterally Breasts: normal appearance, no masses or tenderness, No nipple retraction or dimpling, No nipple discharge or bleeding, No axillary adenopathy Heart: regular rate and rhythm Abdomen: soft, non-tender; no masses, no organomegaly Extremities: extremities normal, atraumatic, no cyanosis or edema Skin: skin color, texture, turgor normal. No rashes or lesions Lymph nodes: cervical, supraclavicular, and axillary nodes normal. Neurologic: grossly normal  Pelvic: External genitalia:  no lesions              No abnormal inguinal nodes palpated.              Urethra:  normal appearing urethra with no masses, tenderness or lesions              Bartholins and Skenes: normal                 Vagina: normal appearing vagina with normal color and discharge, no lesions              Cervix: no lesions              Pap taken: yes Bimanual Exam:  Uterus:  normal size, contour, position, consistency, mobility, non-tender              Adnexa: no mass, fullness, tenderness              Rectal exam: yes.  Confirms.              Anus:  normal sphincter tone, no lesions  Chaperone was present for exam:  05/24/2021, CMA  Assessment:   Well woman visit with gynecologic exam. Status post bilateral salpingectomy. Menopausal symptoms.  On HRT and testosterone therapy. Cervical cancer screening.  FH breast, uterine, ovarian cancer.  Increased risk of melanoma and kidney cancer by genetic testing.  Father with PE hx and rare blood dyscrasia.   Plan: Mammogram screening discussed. Self breast awareness reviewed. Pap and HR HPV as above. Guidelines for Calcium, Vitamin D,  regular exercise program including cardiovascular and weight bearing exercise.   Follow up annually and prn.   After visit summary provided.

## 2021-06-23 LAB — CYTOLOGY - PAP
Adequacy: ABSENT
Comment: NEGATIVE
Diagnosis: NEGATIVE
High risk HPV: NEGATIVE

## 2022-01-23 DIAGNOSIS — K5792 Diverticulitis of intestine, part unspecified, without perforation or abscess without bleeding: Secondary | ICD-10-CM

## 2022-01-23 HISTORY — DX: Diverticulitis of intestine, part unspecified, without perforation or abscess without bleeding: K57.92

## 2022-01-27 ENCOUNTER — Other Ambulatory Visit (HOSPITAL_COMMUNITY): Payer: Self-pay

## 2022-02-16 ENCOUNTER — Encounter (HOSPITAL_BASED_OUTPATIENT_CLINIC_OR_DEPARTMENT_OTHER): Payer: Self-pay

## 2022-02-16 ENCOUNTER — Other Ambulatory Visit (HOSPITAL_COMMUNITY): Payer: Self-pay | Admitting: Family Medicine

## 2022-02-16 ENCOUNTER — Ambulatory Visit (HOSPITAL_BASED_OUTPATIENT_CLINIC_OR_DEPARTMENT_OTHER)
Admission: RE | Admit: 2022-02-16 | Discharge: 2022-02-16 | Disposition: A | Discharge status: Home / Self Care | Payer: 59 | Source: Ambulatory Visit | Attending: Family Medicine | Admitting: Family Medicine

## 2022-02-16 DIAGNOSIS — R319 Hematuria, unspecified: Secondary | ICD-10-CM

## 2022-02-16 DIAGNOSIS — R1032 Left lower quadrant pain: Secondary | ICD-10-CM

## 2022-02-24 ENCOUNTER — Other Ambulatory Visit: Payer: Self-pay | Admitting: Family Medicine

## 2022-02-24 DIAGNOSIS — R935 Abnormal findings on diagnostic imaging of other abdominal regions, including retroperitoneum: Secondary | ICD-10-CM

## 2022-03-17 ENCOUNTER — Ambulatory Visit
Admission: RE | Admit: 2022-03-17 | Discharge: 2022-03-17 | Disposition: A | Discharge status: Home / Self Care | Payer: 59 | Source: Ambulatory Visit | Attending: Family Medicine | Admitting: Family Medicine

## 2022-03-17 DIAGNOSIS — R935 Abnormal findings on diagnostic imaging of other abdominal regions, including retroperitoneum: Secondary | ICD-10-CM

## 2022-03-17 MED ORDER — IOPAMIDOL (ISOVUE-370) INJECTION 76%
75.0000 mL | Freq: Once | INTRAVENOUS | Status: AC | PRN
  Administered 2022-03-17: 75 mL via INTRAVENOUS

## 2022-03-20 ENCOUNTER — Other Ambulatory Visit: Payer: Self-pay | Admitting: Obstetrics and Gynecology

## 2022-03-20 DIAGNOSIS — Z1231 Encounter for screening mammogram for malignant neoplasm of breast: Secondary | ICD-10-CM

## 2022-05-04 ENCOUNTER — Ambulatory Visit
Admission: RE | Admit: 2022-05-04 | Discharge: 2022-05-04 | Disposition: A | Discharge status: Home / Self Care | Payer: 59 | Source: Ambulatory Visit | Attending: Obstetrics and Gynecology | Admitting: Obstetrics and Gynecology

## 2022-05-04 DIAGNOSIS — Z1231 Encounter for screening mammogram for malignant neoplasm of breast: Secondary | ICD-10-CM

## 2022-05-08 ENCOUNTER — Encounter: Payer: Self-pay | Admitting: Obstetrics and Gynecology

## 2022-05-08 ENCOUNTER — Other Ambulatory Visit: Payer: Self-pay | Admitting: Obstetrics and Gynecology

## 2022-05-08 DIAGNOSIS — R928 Other abnormal and inconclusive findings on diagnostic imaging of breast: Secondary | ICD-10-CM

## 2022-05-19 ENCOUNTER — Ambulatory Visit
Admission: RE | Admit: 2022-05-19 | Discharge: 2022-05-19 | Disposition: A | Discharge status: Home / Self Care | Payer: 59 | Source: Ambulatory Visit | Attending: Obstetrics and Gynecology | Admitting: Obstetrics and Gynecology

## 2022-05-19 ENCOUNTER — Other Ambulatory Visit: Payer: Self-pay | Admitting: Obstetrics and Gynecology

## 2022-05-19 DIAGNOSIS — R928 Other abnormal and inconclusive findings on diagnostic imaging of breast: Secondary | ICD-10-CM

## 2022-05-23 ENCOUNTER — Ambulatory Visit
Admission: RE | Admit: 2022-05-23 | Discharge: 2022-05-23 | Disposition: A | Discharge status: Home / Self Care | Payer: 59 | Source: Ambulatory Visit | Attending: Obstetrics and Gynecology | Admitting: Obstetrics and Gynecology

## 2022-05-23 DIAGNOSIS — R928 Other abnormal and inconclusive findings on diagnostic imaging of breast: Secondary | ICD-10-CM

## 2022-05-23 HISTORY — PX: BREAST BIOPSY: SHX20

## 2022-09-08 NOTE — Progress Notes (Signed)
42 y.o. [id number] Married Caucasian female here for annual exam. Wants to discuss irregular periods with cramping   Having 2 menses per month for the last 3 months.  2 - 3 days of intense cramping at the beginning or at the end.  Dark brown flow.   Prior LMP was 8/5 or 8/6.   Having fatigue and irritability also.   Going to [facility] and treating with estradiol, progesterone, and testosterone.  She is not menopausal but perimenopausal.  She is happy with results of her hormone treatment.  Still having hot flashes.  Does blood work every 3 months.   Taking Synthroid and liothyronine.   Diverticulitis in January, 2024.   PCP:   Sigmund Hazel, MD  Patient's last menstrual period was 09/12/2022.     Period Cycle (Days): 14 Period Duration (Days): 4 Period Pattern: (!) Irregular Menstrual Flow: Light Dysmenorrhea: (!) Moderate     Sexually active: Yes.    The current method of family planning is Bil.Salpingectomy .    Exercising: No.   Smoker:  no  Health Maintenance: Pap:  06/21/21 neg: HR HPV neg, 05-12-16 Neg:Neg Hr HPV, 06-21-12 Neg:Neg HR HPV  History of abnormal Pap:  no MMG:  05/19/22 Breast Density cat C, BI-RADS CAT 4 sus. Left breast bx:  fibrocystic change and mild adenosis.   Due for dx left mammogram in October, 2025.  Colonoscopy:  n/a BMD:   n/a  Result  n/a TDaP:  06/14/18 Gardasil:   yes HIV: neg in the past Hep C: n/a Screening Labs:  PCP   reports that she has never smoked. She has never used smokeless tobacco. She reports that she does not currently use alcohol. She reports that she does not use drugs.  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   Low vitamin D level    Migraines    menstrual   SVD (spontaneous vaginal delivery)     x 2    Past Surgical History:  Procedure Laterality Date   ABLATION ON ENDOMETRIOSIS N/A 08/11/2013   Procedure: FULGERATION OF ENDOMETRIOSIS, PERITONEAL BIOPSY;  Surgeon: Jacqualin Combes de Gwenevere Ghazi, MD;  Location: [facility];  Service: Gynecology;  Laterality: N/A;   BREAST BIOPSY Left 05/23/2022   MM LT BREAST BX W LOC DEV 1ST LESION IMAGE BX SPEC STEREO GUIDE 05/23/2022 [facility]   CHOLECYSTECTOMY     DILATION AND CURETTAGE OF UTERUS     x2   LAPAROSCOPIC BILATERAL SALPINGECTOMY Bilateral 08/11/2013   Procedure: LAPAROSCOPIC BILATERAL SALPINGECTOMY;  Surgeon: Forrestine Him Amundson de Gwenevere Ghazi, MD;  Location: [facility];  Service: Gynecology;  Laterality: Bilateral;    Current Outpatient Medications  Medication Sig Dispense Refill   estradiol (VIVELLE-DOT) 0.05 MG/24HR patch 1 patch 2 (two) times a week.     levothyroxine (SYNTHROID) 75 MCG tablet Take 75 mcg by mouth every morning.     liothyronine (CYTOMEL) 5 MCG tablet Take 20 mcg by mouth daily.     magnesium gluconate (MAGONATE) 500 MG tablet Take 500 mg by mouth at bedtime.     PRESCRIPTION MEDICATION Testosterone Cream Sig: 1 click behind knee qhs     progesterone (PROMETRIUM) 100 MG  capsule Take 100 mg by mouth at bedtime.     spironolactone (ALDACTONE) 50 MG tablet Take 50 mg by mouth daily.     Vitamin D-Vitamin K (VITAMIN K2-VITAMIN D3 PO)      No current facility-administered medications for this visit.    Family History  Problem Relation Age of Onset   Hypertension Mother    Hypertension Paternal Grandmother    Ovarian cancer Paternal Grandmother 22   Cancer Paternal Grandmother 7       Ovarian   Leukemia Father    Pulmonary embolism Father    Ovarian cancer Maternal Aunt 7       deceased age 80   Breast cancer Maternal Aunt 58   Uterine cancer Maternal Aunt        dx in her 82s   Diabetes Paternal Uncle    Diabetes Maternal Grandmother    Breast cancer Maternal Aunt 33       deceased age 38    Cancer Maternal Aunt 51       ?endometrial ca   Stroke Paternal Grandfather    Cancer Maternal Uncle        ocular cancer - melanoma   Breast cancer Paternal Aunt     Review of Systems  All other systems reviewed and are negative.   Exam:   BP 124/82 (BP Location: Right Arm, Patient Position: Sitting, Cuff Size: Normal)   Pulse 88   Ht 5' 6.5" (1.689 m)   Wt 184 lb (83.5 kg)   LMP 09/12/2022   SpO2 99%   BMI 29.25 kg/m     General appearance: alert, cooperative and appears stated age Head: normocephalic, without obvious abnormality, atraumatic Neck: no adenopathy, supple, symmetrical, trachea midline and thyroid normal to inspection and palpation Lungs: clear to auscultation bilaterally Breasts: normal appearance, no masses or tenderness, No nipple retraction or dimpling, No nipple discharge or bleeding, No axillary adenopathy Heart: regular rate and rhythm Abdomen: soft, non-tender; no masses, no organomegaly Extremities: extremities normal, atraumatic, no cyanosis or edema Skin: skin color, texture, turgor normal. No rashes or lesions Lymph nodes: cervical, supraclavicular, and axillary nodes normal. Neurologic: grossly normal  Pelvic: External genitalia:  no lesions              No abnormal inguinal nodes palpated.              Urethra:  normal appearing urethra with no masses, tenderness or lesions              Bartholins and Skenes: normal                 Vagina: normal appearing vagina with normal color and discharge, no lesions              Cervix: no lesions              Pap taken: no Bimanual Exam:  Uterus:  normal size, contour, position, consistency, mobility, non-tender              Adnexa: no mass, fullness, tenderness              Rectal exam: yes.  Confirms.              Anus:  normal sphincter tone, no lesions  Chaperone was present for exam:  Warren Lacy, CMA  Assessment:   Well woman visit with gynecologic exam. Status post bilateral  salpingectomy. Menopausal symptoms.  On HRT and testosterone therapy. Irregular menses.  Pelvic cramping.  Hx endometriosis.  Hx diverticulitis FH breast, uterine, ovarian cancer.  Increased risk of melanoma and kidney cancer by genetic testing.  Father with PE hx and rare blood dyscrasia.   Plan: Mammogram dx left due in October, 2014.  She will call to schedule.  Self breast awareness reviewed. Pap and HR HPV as above. Guidelines for Calcium, Vitamin D, regular exercise program including cardiovascular and weight bearing exercise. Will check FSH. Pelvic ultrasound.  We discussed the possible treatment option of ultra low dose COCs for control of menopausal symptoms and irregular menses instead of her HRT regimen.  Referral to GI.  Follow up annually and prn.   After visit summary provided.

## 2022-09-18 ENCOUNTER — Encounter: Payer: Self-pay | Admitting: Obstetrics and Gynecology

## 2022-09-18 ENCOUNTER — Ambulatory Visit (INDEPENDENT_AMBULATORY_CARE_PROVIDER_SITE_OTHER): Payer: 59 | Admitting: Obstetrics and Gynecology

## 2022-09-18 ENCOUNTER — Telehealth: Payer: Self-pay | Admitting: Obstetrics and Gynecology

## 2022-09-18 VITALS — BP 124/82 | HR 88 | Ht 66.5 in | Wt 184.0 lb

## 2022-09-18 DIAGNOSIS — N926 Irregular menstruation, unspecified: Secondary | ICD-10-CM

## 2022-09-18 DIAGNOSIS — N951 Menopausal and female climacteric states: Secondary | ICD-10-CM

## 2022-09-18 DIAGNOSIS — Z8719 Personal history of other diseases of the digestive system: Secondary | ICD-10-CM | POA: Diagnosis not present

## 2022-09-18 DIAGNOSIS — Z01419 Encounter for gynecological examination (general) (routine) without abnormal findings: Secondary | ICD-10-CM

## 2022-09-18 DIAGNOSIS — R102 Pelvic and perineal pain: Secondary | ICD-10-CM | POA: Diagnosis not present

## 2022-09-18 DIAGNOSIS — Z8742 Personal history of other diseases of the female genital tract: Secondary | ICD-10-CM

## 2022-09-18 NOTE — Telephone Encounter (Signed)
Order placed for PUS. 

## 2022-09-18 NOTE — Patient Instructions (Signed)

## 2022-09-18 NOTE — Telephone Encounter (Signed)
Please make an appointment for a pelvic ultrasound at [facility].   My patient has irregular menses, pelvic cramping, and a history of endometriosis.

## 2022-09-19 LAB — FOLLICLE STIMULATING HORMONE: FSH: 10.2 m[IU]/mL

## 2022-10-05 ENCOUNTER — Other Ambulatory Visit: Payer: Self-pay | Admitting: Obstetrics and Gynecology

## 2022-10-05 ENCOUNTER — Encounter: Payer: Self-pay | Admitting: Gastroenterology

## 2022-10-05 DIAGNOSIS — R928 Other abnormal and inconclusive findings on diagnostic imaging of breast: Secondary | ICD-10-CM

## 2022-10-06 NOTE — Telephone Encounter (Signed)
Per review of EPIC, patient is scheduled for PUS on 10/10/22.   Encounter closed.

## 2022-10-10 ENCOUNTER — Ambulatory Visit
Admission: RE | Admit: 2022-10-10 | Discharge: 2022-10-10 | Disposition: A | Discharge status: Home / Self Care | Payer: 59 | Source: Ambulatory Visit | Attending: Obstetrics and Gynecology | Admitting: Obstetrics and Gynecology

## 2022-10-10 DIAGNOSIS — R102 Pelvic and perineal pain: Secondary | ICD-10-CM

## 2022-10-10 DIAGNOSIS — N926 Irregular menstruation, unspecified: Secondary | ICD-10-CM

## 2022-10-10 DIAGNOSIS — Z8742 Personal history of other diseases of the female genital tract: Secondary | ICD-10-CM

## 2022-11-13 ENCOUNTER — Encounter: Payer: Self-pay | Admitting: Obstetrics and Gynecology

## 2022-11-23 ENCOUNTER — Telehealth: Payer: Self-pay | Admitting: Obstetrics and Gynecology

## 2022-11-23 NOTE — Telephone Encounter (Signed)
Patient had an ultrasound in an outside facility that Dr Edward Jolly ordered. Patient need to return to the office for results. Message left on October 1 & 9 and letter mailed on October 21 for patient to call and schedule follow up appointment; patient has not called back.

## 2022-11-24 NOTE — Telephone Encounter (Signed)
Per review of EPIC, patient notified of results and recommendations per Dr. Edward Jolly. Seen by patient on 10/20/22 at 3:11PM.   Routing to Dr. Marjorie Smolder.   Encounter closed.

## 2022-11-29 ENCOUNTER — Other Ambulatory Visit: Payer: Self-pay | Admitting: Obstetrics and Gynecology

## 2022-11-29 ENCOUNTER — Ambulatory Visit
Admission: RE | Admit: 2022-11-29 | Discharge: 2022-11-29 | Disposition: A | Discharge status: Home / Self Care | Payer: 59 | Source: Ambulatory Visit | Attending: Obstetrics and Gynecology | Admitting: Obstetrics and Gynecology

## 2022-11-29 DIAGNOSIS — N6489 Other specified disorders of breast: Secondary | ICD-10-CM

## 2022-11-29 DIAGNOSIS — R928 Other abnormal and inconclusive findings on diagnostic imaging of breast: Secondary | ICD-10-CM

## 2023-01-24 DIAGNOSIS — K900 Celiac disease: Secondary | ICD-10-CM

## 2023-01-24 HISTORY — DX: Celiac disease: K90.0

## 2023-01-30 ENCOUNTER — Ambulatory Visit: Payer: 59 | Admitting: Gastroenterology

## 2023-01-30 ENCOUNTER — Encounter: Payer: Self-pay | Admitting: Gastroenterology

## 2023-01-30 ENCOUNTER — Other Ambulatory Visit: Payer: 59

## 2023-01-30 VITALS — BP 102/68 | HR 84 | Ht 66.0 in | Wt 192.1 lb

## 2023-01-30 DIAGNOSIS — R140 Abdominal distension (gaseous): Secondary | ICD-10-CM

## 2023-01-30 DIAGNOSIS — R194 Change in bowel habit: Secondary | ICD-10-CM

## 2023-01-30 DIAGNOSIS — R1032 Left lower quadrant pain: Secondary | ICD-10-CM

## 2023-01-30 DIAGNOSIS — K9049 Malabsorption due to intolerance, not elsewhere classified: Secondary | ICD-10-CM | POA: Diagnosis not present

## 2023-01-30 DIAGNOSIS — Z8719 Personal history of other diseases of the digestive system: Secondary | ICD-10-CM

## 2023-01-30 MED ORDER — IBGARD 90 MG PO CPCR: ORAL_CAPSULE | ORAL | Status: DC

## 2023-01-30 MED ORDER — SUTAB 1479-225-188 MG PO TABS: 24.0000 | ORAL_TABLET | Freq: Once | ORAL | 0 refills | Status: AC

## 2023-01-30 MED ORDER — CITRUCEL PO POWD: 1.0000 | Freq: Every day | ORAL | Status: AC

## 2023-01-30 NOTE — Progress Notes (Signed)
 HPI :  43 year old female with a history of diverticulitis, altered bowel habits, here to establish care for some of these issues.  She reports she was diagnosed with diverticulitis in January 2024 when she presented with severe pain in her left lower quadrant.  She had a CT scan done at that time showing acute uncomplicated sigmoid diverticulitis.  She was treated with antibiotics with eventual improvement of her symptoms.  Since that time she has not had any severe recurrence of symptoms or flareups of her initial episode which was quite severe, however she has had some occasional discomfort in that area that can bother her.  She can feel some bloating frequently.  She has a bowel movement every day, perhaps once or twice a day but has a sense of incomplete evacuation and some urgency at times.  Sometimes her stools are not well-formed sometimes they are regular.  She denies any blood in her stools.  She is a ambulance person, she states for a long time she has had intolerance of meat which can cause abdominal pains and bowel problems.  She also has this problem when she eats bread.  She has a lot of gas and bloating that can bother her.  She states the symptoms are longstanding and she tries to avoid dairy.  She thinks her uncle has had colon cancer, no family history of colon cancer in first-degree relatives.  She denies any Cardiel pulmonary symptoms and is status post cholecystectomy.  She has never had a colonoscopy.  She has occasional heartburn, occasional nausea when she has abdominal pains.  No vomiting.  She is currently not taking anything for her bowels on a routine basis.   Prior evaluation: CT scan abd / pelvis without contrast 02/16/22: IMPRESSION: 1. Acute uncomplicated sigmoid diverticulitis. 2. 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 Suggest further evaluation with multiphase CTA including portal venous and venous phases.  CTA 03/17/22: 1. 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 2. Question mild infra hepatic IVC stenosis,. No CTV evidence of IVC occlusion or discontinuity.   NON-VASCULAR   1. Symmetric nephrograms. 2. No acute abdominopelvic process.     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   Low vitamin D  level    Migraines    menstrual   SVD (spontaneous vaginal delivery)    x 2     Past Surgical History:  Procedure Laterality Date   ABLATION ON ENDOMETRIOSIS N/A 08/11/2013   Procedure: FULGERATION OF ENDOMETRIOSIS, PERITONEAL BIOPSY;  Surgeon: Bobie FORBES Crown de Charlynn FORBES Cary, MD;  Location: [facility];  Service: Gynecology;  Laterality: N/A;   BREAST BIOPSY Left 05/23/2022   MM LT BREAST BX W LOC DEV 1ST LESION IMAGE BX SPEC STEREO GUIDE 05/23/2022 [facility]   CHOLECYSTECTOMY     DILATION AND CURETTAGE OF UTERUS     x2   LAPAROSCOPIC BILATERAL SALPINGECTOMY Bilateral 08/11/2013   Procedure: LAPAROSCOPIC BILATERAL SALPINGECTOMY;  Surgeon: Bobie BRAVO Amundson de Charlynn BRAVO Cary, MD;  Location: [facility];  Service:  Gynecology;  Laterality: Bilateral;   Family History  Problem Relation Age of Onset   Hypertension Mother    Hypertension Paternal Grandmother    Ovarian cancer Paternal Grandmother 22   Cancer Paternal Grandmother 61       Ovarian   Leukemia Father    Pulmonary embolism Father    Ovarian cancer Maternal Aunt 74       deceased age 55   Breast cancer Maternal Aunt 42   Uterine cancer Maternal Aunt        dx in her 39s   Diabetes Paternal Uncle    Diabetes Maternal Grandmother    Breast cancer Maternal Aunt 80       deceased age 67   Cancer Maternal Aunt 27       ?endometrial ca   Stroke Paternal Grandfather    Cancer Maternal Uncle        ocular cancer - melanoma   Breast cancer Paternal Aunt    Social History   Tobacco Use   Smoking status: Never   Smokeless tobacco: Never  Vaping Use   Vaping status: Never Used  Substance Use Topics   Alcohol use: Not Currently   Drug use: No   Current Outpatient Medications  Medication Sig Dispense Refill   levothyroxine (SYNTHROID) 75 MCG tablet Take 75 mcg by mouth every morning.     liothyronine (CYTOMEL) 5 MCG tablet Take 20 mcg by mouth daily.     magnesium gluconate (MAGONATE) 500 MG tablet Take 500 mg by mouth at bedtime.     PRESCRIPTION MEDICATION Testosterone Cream Sig: 1 click behind knee qhs     progesterone (PROMETRIUM) 200 MG capsule Take 200 mg by mouth at bedtime.     spironolactone (ALDACTONE) 50 MG tablet Take 50 mg by mouth daily.     Vitamin D -Vitamin K (VITAMIN K2-VITAMIN D3 PO)      estradiol  (VIVELLE -DOT) 0.05 MG/24HR patch 1 patch 2 (two) times a week. (Patient not taking: Reported on 01/30/2023)     No current facility-administered medications for this visit.   Allergies  Allergen Reactions   Topiramate Palpitations and Other (See Comments)     Review of Systems: All systems reviewed and negative except where noted in HPI.   No recent labs on file  Physical Exam: BP 102/68   Pulse 84   Ht 5'  6 (1.676 m)   Wt 192 lb 2 oz (87.1 kg)   BMI 31.01 kg/m  Constitutional: Pleasant,well-developed, female in no acute distress. HEENT: Normocephalic and atraumatic. Conjunctivae are normal. No scleral icterus. Neck supple.  Cardiovascular: Normal rate, regular rhythm.  Pulmonary/chest: Effort normal and breath sounds normal.  Abdominal: Soft, nondistended, nontender. There are no masses palpable. Extremities: no edema Neurological: Alert and oriented to person place and time. Skin: Skin is warm and dry. No rashes noted. Psychiatric: Normal mood and affect. Behavior is normal.   ASSESSMENT: 43 y.o. female here for assessment of the following  1. History of diverticulitis   2. Bloating  3. LLQ pain   4. Altered bowel habits   5. Food intolerance    History of diverticulitis about a year ago, confirmed on CT scan and responded appropriately to therapy.  She has some baseline bloating and discomfort periodically in her left lower quadrant.  Her bowels are a bit altered recently with some urgency, change in stool form.  She has food intolerance to dairy, bread, and meat will reliably cause abdominal pain.  I reviewed diverticulitis with her, risks for recurrence.  Should she have recurrent symptoms from this she will contact us  for evaluation and treatment.  Otherwise she does have clear dietary triggers that bother her.  I think reasonable to test her for alpha gal given strong reaction to meat, and also screen her for celiac disease.  We discussed some measures to help reduce her symptoms.  I recommend Citrucel once daily to provide some bowel habit regularity.  She can try using some IBgard as needed for abdominal cramping.  I also provided her handout on a low FODMAP diet to try to reduce her bloating, and could try Fodzyme supplement as well for bloating and gas.  Ultimately she warrants a colonoscopy given her history of diverticulitis and CT changes.  I discussed risks and benefits  of colonoscopy and anesthesia and she wants to proceed.  Further recommendations pending the results.   PLAN: - schedule colonoscopy at the [facility] - start Citrucel once daily - start trial of IB gard to use PRN, free samples provided - handout on low FODMAP diet as well as Lyna supplement - lab for alpha gal and celiac lab - counseled on diverticulitis and risks for recurrence  Marcey Naval, MD  Gastroenterology  CC: Cleotilde Planas, MD

## 2023-01-30 NOTE — Patient Instructions (Addendum)
 You have been scheduled for a colonoscopy. Please follow written instructions given to you at your visit today.   Please pick up your prep supplies at the pharmacy within the next 1-3 days.  If you use inhalers (even only as needed), please bring them with you on the day of your procedure.  DO NOT TAKE 7 DAYS PRIOR TO TEST- Trulicity (dulaglutide) Ozempic, Wegovy (semaglutide) Mounjaro (tirzepatide) Bydureon Bcise (exanatide extended release)  DO NOT TAKE 1 DAY PRIOR TO YOUR TEST Rybelsus (semaglutide) Adlyxin (lixisenatide) Victoza (liraglutide) Byetta (exanatide) ___________________________________________________________________________  Please go to the lab in the basement of our building to have lab work done as you leave today. Hit B for basement when you get on the elevator.  When the doors open the lab is on your left.  We will call you with the results. Thank you.  Please purchase the following medications over the counter and take as directed: Citrucel - once daily  We have given you samples of the following medication to take: IBGard - use as directed as needed  We are giving you a Low-FODMAP diet handout today. FODMAPs are short-chain carbohydrates (sugars) that are highly fermentable, which means that they go through chemical changes in the GI system, and are poorly absorbed during digestion. When FODMAPs reach the colon (large intestine), bacteria ferment these sugars, turning them into gas and chemicals. This stretches the walls of the colon, causing abdominal bloating, distension, cramping, pain, and/or changes in bowel habits in many patients with IBS. FODMAPs are not unhealthy or harmful, but may exacerbate GI symptoms in those with sensitive GI tracts.  We are giving you a handout today regarding Fodzymes.  Thank you for entrusting me with your care and for choosing [facility], Dr. Elspeth Naval    _______________________________________________________  If your blood pressure at your visit was 140/90 or greater, please contact your primary care physician to follow up on this.  _______________________________________________________  If you are age 16 or older, your body mass index should be between 23-30. Your Body mass index is 31.01 kg/m. If this is out of the aforementioned range listed, please consider follow up with your Primary Care Provider.  If you are age 40 or younger, your body mass index should be between 19-25. Your Body mass index is 31.01 kg/m. If this is out of the aformentioned range listed, please consider follow up with your Primary Care Provider.   ________________________________________________________  The [facility] providers would like to encourage you to use MYCHART to communicate with providers for non-urgent requests or questions.  Due to long hold times on the telephone, sending your provider a message by [facility] may be a faster and more efficient way to get a response.  Please allow 48 business hours for a response.  Please remember that this is for non-urgent requests.  _______________________________________________________

## 2023-02-02 LAB — ALPHA-GAL PANEL
Allergen, Mutton, f88: <0.1 kU/L
Allergen, Pork, f26: <0.1 kU/L
Beef: <0.1 kU/L
CLASS: 0
CLASS: 0
Class: 0
GALACTOSE-ALPHA-1,3-GALACTOSE IGE*: <0.1 kU/L (ref <0.10)

## 2023-02-02 LAB — INTERPRETATION:

## 2023-02-02 LAB — IGA: Immunoglobulin A: 131 mg/dL (ref 47–310)

## 2023-02-02 LAB — TISSUE TRANSGLUTAMINASE, IGA: (tTG) Ab, IgA: >250 U/mL — ABNORMAL HIGH

## 2023-02-05 ENCOUNTER — Encounter: Payer: Self-pay | Admitting: Gastroenterology

## 2023-02-05 ENCOUNTER — Ambulatory Visit: Payer: 59 | Admitting: Gastroenterology

## 2023-02-05 VITALS — BP 106/60 | HR 89 | Temp 98.2°F | Resp 14 | Ht 66.0 in | Wt 192.0 lb

## 2023-02-05 DIAGNOSIS — K6289 Other specified diseases of anus and rectum: Secondary | ICD-10-CM | POA: Diagnosis not present

## 2023-02-05 DIAGNOSIS — K3189 Other diseases of stomach and duodenum: Secondary | ICD-10-CM

## 2023-02-05 DIAGNOSIS — K2980 Duodenitis without bleeding: Secondary | ICD-10-CM | POA: Diagnosis not present

## 2023-02-05 DIAGNOSIS — Z8719 Personal history of other diseases of the digestive system: Secondary | ICD-10-CM

## 2023-02-05 DIAGNOSIS — K317 Polyp of stomach and duodenum: Secondary | ICD-10-CM

## 2023-02-05 DIAGNOSIS — R140 Abdominal distension (gaseous): Secondary | ICD-10-CM

## 2023-02-05 DIAGNOSIS — K2100 Gastro-esophageal reflux disease with esophagitis, without bleeding: Secondary | ICD-10-CM

## 2023-02-05 DIAGNOSIS — K648 Other hemorrhoids: Secondary | ICD-10-CM | POA: Diagnosis not present

## 2023-02-05 DIAGNOSIS — K5730 Diverticulosis of large intestine without perforation or abscess without bleeding: Secondary | ICD-10-CM

## 2023-02-05 DIAGNOSIS — R768 Other specified abnormal immunological findings in serum: Secondary | ICD-10-CM

## 2023-02-05 DIAGNOSIS — K449 Diaphragmatic hernia without obstruction or gangrene: Secondary | ICD-10-CM | POA: Diagnosis not present

## 2023-02-05 DIAGNOSIS — K2970 Gastritis, unspecified, without bleeding: Secondary | ICD-10-CM

## 2023-02-05 MED ORDER — SODIUM CHLORIDE 0.9 % IV SOLN: 500.0000 mL | Freq: Once | INTRAVENOUS | Status: DC

## 2023-02-05 MED ORDER — OMEPRAZOLE 40 MG PO CPDR: 40.0000 mg | DELAYED_RELEASE_CAPSULE | Freq: Every day | ORAL | 3 refills | Status: AC

## 2023-02-05 NOTE — Progress Notes (Signed)
 Called to room to assist during endoscopic procedure.  Patient ID and intended procedure confirmed with present staff. Received instructions for my participation in the procedure from the performing physician.

## 2023-02-05 NOTE — Progress Notes (Signed)
 Sedate, gd SR, tolerated procedure well, VSS, report to RN

## 2023-02-05 NOTE — Op Note (Signed)
 [facility] Patient Name: Jane Vaughn Procedure Date: 02/05/2023 1:26 PM MRN: [id number] Endoscopist: Elspeth P. Leigh , MD, [id number] Age: 43 Referring MD:  Date of Birth: [birth year] Gender: Female Account #: [id number] Procedure:                Colonoscopy Indications:              history of sigmoid diverticulitis Jan 2024, altered                            bowel habits Medicines:                Monitored Anesthesia Care Procedure:                Pre-Anesthesia Assessment:                           - Prior to the procedure, a History and Physical                            was performed, and patient medications and                            allergies were reviewed. The patient's tolerance of                            previous anesthesia was also reviewed. The risks                            and benefits of the procedure and the sedation                            options and risks were discussed with the patient.                            All questions were answered, and informed consent                            was obtained. Prior Anticoagulants: The patient has                            taken no anticoagulant or antiplatelet agents. ASA                            Grade Assessment: II - A patient with mild systemic                            disease. After reviewing the risks and benefits,                            the patient was deemed in satisfactory condition to                            undergo the procedure.  After obtaining informed consent, the colonoscope                            was passed under direct vision. Throughout the                            procedure, the patient's blood pressure, pulse, and                            oxygen saturations were monitored continuously. The                            CF [id number] #[id number] was introduced through the anus                            and advanced to the the cecum,  identified by                            appendiceal orifice and ileocecal valve. The                            colonoscopy was performed without difficulty. The                            patient tolerated the procedure well. The quality                            of the bowel preparation was good. The ileocecal                            valve, appendiceal orifice, and rectum were                            photographed. Scope In: 1:41:32 PM Scope Out: 1:54:42 PM Scope Withdrawal Time: 0 hours 8 minutes 44 seconds  Total Procedure Duration: 0 hours 13 minutes 10 seconds  Findings:                 The perianal and digital rectal examinations were                            normal.                           A few small-mouthed diverticula were found in the                            sigmoid colon.                           Internal hemorrhoids were found during                            retroflexion. The hemorrhoids were small.  Anal papilla(e) were hypertrophied.                           The exam was otherwise without abnormality on                            direct and retroflexion views. Complications:            No immediate complications. Estimated blood loss:                            None. Estimated Blood Loss:     Estimated blood loss: none. Impression:               - Diverticulosis in the sigmoid colon.                           - Internal hemorrhoids.                           - Anal papilla(e) were hypertrophied.                           - The examination was otherwise normal on direct                            and retroflexion views.                           - No polyps. Recommendation:           - Patient has a contact number available for                            emergencies. The signs and symptoms of potential                            delayed complications were discussed with the                            patient. Return to normal  activities tomorrow.                            Written discharge instructions were provided to the                            patient.                           - Resume previous diet.                           - Continue present medications.                           - Repeat colonoscopy in 10 years for screening  purposes.                           - Follow up as needed for recurrent diverticulitis                            symptoms. Awaiting trial of gluten free diet and                            fiber supplement [name] P. Leigh, MD 02/05/2023 2:02:41 PM This report has been signed electronically.

## 2023-02-05 NOTE — Progress Notes (Signed)
 Pt's states no medical or surgical changes since previsit or office visit.

## 2023-02-05 NOTE — Patient Instructions (Addendum)
 Resume previous diet ( Start Gluten free diet- See handout) Take fiber supplement (fibercon) see handout Continue present medications Avoid use of  NSAIDS (Non-Steroidal anti-inflammatory drugs).  (These include, aspirin, aspirin-containing products(products containing salicylic acid like Pepto Bismol and Alka Seltzer), ibuprofen , advil , motrin , naproxen, aleve, goody powders, etc) Tylenol  is ok to take as needed, see label for instructions. Await pathology results  Handouts/information given for gastritis, Gluten free diet, Celiac disease, fiber supplement, omeprazole , Gastric polyps, diverticulosis and hemorrhoids  YOU HAD AN ENDOSCOPIC PROCEDURE TODAY AT THE [facility]:   Refer to the procedure report that was given to you for any specific questions about what was found during the examination.  If the procedure report does not answer your questions, please call your gastroenterologist to clarify.  If you requested that your care partner not be given the details of your procedure findings, then the procedure report has been included in a sealed envelope for you to review at your convenience later.  YOU SHOULD EXPECT: Some feelings of bloating in the abdomen. Passage of more gas than usual.  Walking can help get rid of the air that was put into your GI tract during the procedure and reduce the bloating. If you had a lower endoscopy (such as a colonoscopy or flexible sigmoidoscopy) you may notice spotting of blood in your stool or on the toilet paper. If you underwent a bowel prep for your procedure, you may not have a normal bowel movement for a few days.  Please Note:  You might notice some irritation and congestion in your nose or some drainage.  This is from the oxygen used during your procedure.  There is no need for concern and it should clear up in a day or so.  SYMPTOMS TO REPORT IMMEDIATELY:  Following lower endoscopy (colonoscopy):  Excessive amounts of blood in the  stool  Significant tenderness or worsening of abdominal pains  Swelling of the abdomen that is new, acute  Fever of 100F or higher Following upper endoscopy (EGD)  Vomiting of blood or coffee ground material  New chest pain or pain under the shoulder blades  Painful or persistently difficult swallowing  New shortness of breath  Fever of 100F or higher  Black, tarry-looking stools  For urgent or emergent issues, a gastroenterologist can be reached at any hour by calling [phone]. Do not use MyChart messaging for urgent concerns.   DIET:  We do recommend a small meal at first, but then you may proceed to your regular diet.  Drink plenty of fluids but you should avoid alcoholic beverages for 24 hours.  ACTIVITY:  You should plan to take it easy for the rest of today and you should NOT DRIVE or use heavy machinery until tomorrow (because of the sedation medicines used during the test).    FOLLOW UP: Our staff will call the number listed on your records the next business day following your procedure.  We will call around 7:15- 8:00 am to check on you and address any questions or concerns that you may have regarding the information given to you following your procedure. If we do not reach you, we will leave a message.     If any biopsies were taken you will be contacted by phone or by letter within the next 1-3 weeks.  Please call us  at [phone] if you have not heard about the biopsies in 3 weeks.   SIGNATURES/CONFIDENTIALITY: You and/or your care partner have signed paperwork which will  be entered into your electronic medical record.  These signatures attest to the fact that that the information above on your After Visit Summary has been reviewed and is understood.  Full responsibility of the confidentiality of this discharge information lies with you and/or your care-partner.

## 2023-02-05 NOTE — Op Note (Signed)
 [facility] Patient Name: Jane Vaughn Procedure Date: 02/05/2023 1:27 PM MRN: [id number] Endoscopist: Elspeth P. Leigh , MD, [id number] Age: 43 Referring MD:  Date of Birth: [birth year] Gender: Female Account #: [id number] Procedure:                Upper GI endoscopy Indications:              Positive celiac serologies, bloating, altered bowel                            habits Medicines:                Monitored Anesthesia Care Procedure:                Pre-Anesthesia Assessment:                           - Prior to the procedure, a History and Physical                            was performed, and patient medications and                            allergies were reviewed. The patient's tolerance of                            previous anesthesia was also reviewed. The risks                            and benefits of the procedure and the sedation                            options and risks were discussed with the patient.                            All questions were answered, and informed consent                            was obtained. Prior Anticoagulants: The patient has                            taken no anticoagulant or antiplatelet agents. ASA                            Grade Assessment: II - A patient with mild systemic                            disease. After reviewing the risks and benefits,                            the patient was deemed in satisfactory condition to                            undergo the procedure.  After obtaining informed consent, the endoscope was                            passed under direct vision. Throughout the                            procedure, the patient's blood pressure, pulse, and                            oxygen saturations were monitored continuously. The                            Olympus Scope [id number] was introduced through the                            mouth, and advanced to the second  part of duodenum.                            The upper GI endoscopy was accomplished without                            difficulty. The patient tolerated the procedure                            well. Scope In: Scope Out: Findings:                 Esophagogastric landmarks were identified: the                            Z-line was found at 37 cm, the gastroesophageal                            junction was found at 37 cm and the upper extent of                            the gastric folds was found at 38 cm from the                            incisors.                           A 1 cm hiatal hernia was present.                           LA Grade A esophagitis was found at the GEJ.                           The exam of the esophagus was otherwise normal.                           Diffuse inflammation characterized by adherent                            blood, erosions, erythema  and granularity was found                            in the gastric body and in the gastric antrum.                            Biopsies were taken with a cold forceps for                            Helicobacter pylori testing.                           Multiple small sessile polyps were found in the                            gastric fundus and in the gastric body. One                            representative polyp was removed with a cold biopsy                            forceps. Resection and retrieval were complete.                           The exam of the stomach was otherwise normal.                           Scalloped mucosa was found in the duodenal bulb and                            duodenal sweep. Biopsies were taken with a cold                            forceps for histology.                           The exam of the duodenum was otherwise normal.                            Biopsies were taken to assess for celiac disease. Complications:            No immediate complications. Estimated blood loss:                             Minimal. Estimated Blood Loss:     Estimated blood loss was minimal. Impression:               - Esophagogastric landmarks identified.                           - 1 cm hiatal hernia.                           - LA Grade A reflux esophagitis.                           -  Normal esophagus otherwise.                           - Gastritis. Biopsied.                           - Multiple gastric polyps. Representative sample                            resected and retrieved.                           - Normal stomach otherwise.                           - Duodenal mucosal changes seen, suspicious for                            celiac disease. Biopsied. Recommendation:           - Patient has a contact number available for                            emergencies. The signs and symptoms of potential                            delayed complications were discussed with the                            patient. Return to normal activities tomorrow.                            Written discharge instructions were provided to the                            patient.                           - Okay to try gluten free diet at this time while                            pathology results pending.                           - Continue present medications.                           - Start omeprazole  40mg  / day for 30 days to treat                            gastritis / esophagitis                           - Avoid NSAID use                           - Await pathology results with further  recommendations. Elspeth P. Egon Dittus, MD 02/05/2023 1:59:09 PM This report has been signed electronically.

## 2023-02-05 NOTE — Progress Notes (Signed)
 History and Physical Interval Note: Patient seen on 01/30/23 - no interval changes.   History of sigmoid diverticulitis on CT Jan 2024. Has occasional lower abdominal discomfort, and bloating and some food intolerance, particularly dairy. TTG IgA level sent and markedly positive.  Started on Citrucel in the office, low FODMAP diet. EGD to evaluate for celiac disease and colonoscopy in light of history of diverticulitis and her symptoms.   She otherwise feels well today without complaints.   02/05/2023 1:21 PM  Jane Vaughn  has presented today for endoscopic procedure(s), with the diagnosis of  Encounter Diagnoses  Name Primary?   History of diverticulitis Yes   Bloating    Elevated anti-tissue transglutaminase (tTG) IgA level   .  The various methods of evaluation and treatment have been discussed with the patient and/or family. After consideration of risks, benefits and other options for treatment, the patient has consented to  the endoscopic procedure(s).   The patient's history has been reviewed, patient examined, no change in status, stable for surgery.  I have reviewed the patient's chart and labs.  Questions were answered to the patient's satisfaction.    Marcey Naval, MD [facility]

## 2023-02-06 ENCOUNTER — Telehealth: Payer: Self-pay | Admitting: *Deleted

## 2023-02-06 NOTE — Telephone Encounter (Signed)
  Follow up Call-     02/05/2023    1:02 PM  Call back number  Post procedure Call Back phone  # [phone]  Permission to leave phone message Yes     Patient questions:  Do you have a fever, pain , or abdominal swelling? No. Pain Score  0 *  Have you tolerated food without any problems? Yes.    Have you been able to return to your normal activities? Yes.    Do you have any questions about your discharge instructions: Diet   No. Medications  No. Follow up visit  No.  Do you have questions or concerns about your Care? No.  Actions: * If pain score is 4 or above: No action needed, pain <4.

## 2023-02-09 ENCOUNTER — Encounter: Payer: Self-pay | Admitting: Gastroenterology

## 2023-02-09 LAB — SURGICAL PATHOLOGY

## 2023-02-12 ENCOUNTER — Other Ambulatory Visit: Payer: Self-pay

## 2023-02-12 DIAGNOSIS — K900 Celiac disease: Secondary | ICD-10-CM

## 2023-02-12 DIAGNOSIS — Z13820 Encounter for screening for osteoporosis: Secondary | ICD-10-CM

## 2023-02-21 ENCOUNTER — Encounter: Payer: Self-pay | Admitting: Gastroenterology

## 2023-02-21 ENCOUNTER — Ambulatory Visit (INDEPENDENT_AMBULATORY_CARE_PROVIDER_SITE_OTHER)
Admission: RE | Admit: 2023-02-21 | Discharge: 2023-02-21 | Disposition: A | Discharge status: Home / Self Care | Payer: 59 | Source: Ambulatory Visit | Attending: Gastroenterology

## 2023-02-21 DIAGNOSIS — K900 Celiac disease: Secondary | ICD-10-CM

## 2023-02-21 DIAGNOSIS — Z13820 Encounter for screening for osteoporosis: Secondary | ICD-10-CM | POA: Diagnosis not present

## 2023-03-23 ENCOUNTER — Encounter: Payer: 59 | Attending: Gastroenterology | Admitting: Dietician

## 2023-03-23 ENCOUNTER — Encounter: Payer: Self-pay | Admitting: Dietician

## 2023-03-23 DIAGNOSIS — K900 Celiac disease: Secondary | ICD-10-CM

## 2023-03-23 NOTE — Progress Notes (Signed)
 Medical Nutrition Therapy  Appointment Start time:  1320  Appointment End time:  1420  Primary concerns today: newly diagnosed celiac  Referral diagnosis: newly diagnosed celiac Preferred learning style:  no preference indicated Learning readiness: ready, change in progress   NUTRITION ASSESSMENT   Clinical Medical Hx: diverticulitis, celiac Medications: levothyroxine, liothyronine Labs: IgA >250 Notable Signs/Symptoms: improved GI symptoms since following a gluten free diet.  Lifestyle & Dietary Hx Patient lives with her husband and son.  She does the shopping and cooking. She works as an Interior and spatial designer for [employer]. Gluten free, pescaterian, no pork (intolerant), beef (intolerant) She does not tolerate lactose (milk, yogurt, ice cream) She has been eating more whole foods since celiac diagnosis. Her husband grows a large garden.  Estimated daily fluid intake: >64 oz daily Supplements: magnesium glycinate, vitamin D3 K2 Sleep: good Stress / self-care: fair Current average weekly physical activity: walking 4 days per week or 30-45 minutes  24-Hr Dietary Recall First Meal: coffee with oatmilk creamer, GF wrap with scrambled egg, shredded cheese, onions Snack: none Second Meal 3 pm: brings from home (salad, berries, mushrooms, avocado, raspberry vinaigrette, tuna salad at times, coconut milk yogurt or sigi's plant based yogurt, mandarin oranges or grapes, string cheese Snack: none Third Meal: smoked salmon, capers, goat cheese, fig preserves, hot honey, pistachios, raw vegetables, cheese Snack: none Beverages: water, coffee oatmilk creamer, occasional wine     Estimated Energy Needs Protein: 65-75g  NUTRITION DIAGNOSIS  NB-1.1 Food and nutrition-related knowledge deficit As related to gluten free diet due to celiac disease and calcium intake on plant based  diet.  As evidenced by patient report.   NUTRITION INTERVENTION  Nutrition education (E-1) on the following topics:   GF diet and celiac - foods allowed and not allowed Simple GF meal ideas Avoiding cross contamination Gluten and medication Calcium intake on plant based diet Protein adequacy and plant based sources  Handouts Provided Include  Celiac Nutrition Therapy from AND Getting plant based calcium in the diet article from Today's Dietitian Gluten and medication from the Celiac Disease Foundation Medication and Celiac - tips from a pharmacist Go Gluten Free!  Tips for getting more nutrition in a gluten free diet Resources  Learning Style & Readiness for Change Teaching method utilized: Visual & Auditory  Demonstrated degree of understanding via: Teach Back  Barriers to learning/adherence to lifestyle change: none  Goals Established by Pt Protein with each meal Be mindful of cross contamination (toaster, colander, eating out)   MONITORING & EVALUATION Dietary intake, weekly physical activity, and label reading prn  Next Steps  Patient is to call for questions.

## 2023-03-23 NOTE — Patient Instructions (Addendum)
 Add tofu, rinsed beans, tuna, and/or nuts and seeds to increase the protein of your salad.  Add Hemp seeds to your yogurt  Be mindful of cross contamination (buy a new colander?)  Resources:  Beyond Celiac Celiac [place] Celiac Disease Foundation Gluten free drugs.com

## 2023-06-05 ENCOUNTER — Ambulatory Visit: Payer: Self-pay | Admitting: Obstetrics and Gynecology

## 2023-06-05 ENCOUNTER — Ambulatory Visit
Admission: RE | Admit: 2023-06-05 | Discharge: 2023-06-05 | Disposition: A | Discharge status: Home / Self Care | Source: Ambulatory Visit | Attending: Obstetrics and Gynecology | Admitting: Obstetrics and Gynecology

## 2023-06-05 ENCOUNTER — Other Ambulatory Visit: Payer: Self-pay | Admitting: Obstetrics and Gynecology

## 2023-06-05 DIAGNOSIS — N644 Mastodynia: Secondary | ICD-10-CM

## 2023-06-05 DIAGNOSIS — N6489 Other specified disorders of breast: Secondary | ICD-10-CM

## 2023-09-28 NOTE — Progress Notes (Signed)
 43 y.o. [id number] Married Caucasian female here for annual exam.    Had frequent menses and pelvic cramping last year.  Pelvic US  normal on 10/10/23.   Periods are now irregular. Occur every 30 - 45 days.  Bleeding lasts 2 days.  She takes progesterone 200 mg every evening and testosterone 1 click every evening for perimenopausal symptoms.  [facility] is monitoring through blood work.   Hot flashes off and on but much less often now.    New dx of celiac disease.  Feels much better begin gluten free.   Taking calcium, vit D with K2.  Sons are doing well.   PCP: Cleotilde Planas, MD   No LMP recorded. Patient is perimenopausal.           Sexually active: Yes.    The current method of family planning is Bilateral Salpingectomy.    Menopausal hormone therapy:  n/a Exercising: Yes.    Cardio Smoker:  no  OB History  Gravida Para Term Preterm AB Living  5    3 2   SAB IAB Ectopic Multiple Live Births  2 1       # Outcome Date GA Lbr Len/2nd Weight Sex Type Anes PTL Lv  5 IAB 11/2011          4 Gravida 04/2009 [redacted]w[redacted]d  7 lb 14 oz (3.572 kg) M Vag-Spont     3 SAB 2009          2 Gravida 02/2004 [redacted]w[redacted]d  4 lb 12 oz (2.155 kg) M Vag-Spont     1 SAB 2005             HEALTH MAINTENANCE: Last 2 paps:  06/21/21 neg HR HPV neg, 05-12-16 Neg:Neg Hr HPV  History of abnormal Pap or positive HPV:  no Mammogram:   06/05/23 Breast Density Cat C, BIRADS Cat 3 Prob Benign  Colonoscopy:  02/05/23 - Dr. Leigh.   Bone Density:  02/21/23  Result  normal    Immunization History  Administered Date(s) Administered   HPV 9-valent 06/14/2018, 08/15/2018, 12/23/2018   Influenza,inj,Quad PF,6+ Mos 10/08/2018   Influenza-Unspecified 11/08/2016   PFIZER(Purple Top)SARS-COV-2 Vaccination 04/05/2019, 04/30/2019   Tdap 06/14/2018      reports that she has never smoked. She has never used smokeless tobacco. She reports that she does not currently use alcohol. She reports that she does not use  drugs.  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   Low vitamin D  level    Migraines    menstrual   SVD (spontaneous vaginal delivery)    x 2    Past Surgical History:  Procedure Laterality Date   ABLATION ON ENDOMETRIOSIS N/A 08/11/2013   Procedure: FULGERATION OF ENDOMETRIOSIS, PERITONEAL BIOPSY;  Surgeon: Bobie FORBES Crown de Charlynn FORBES Cary, MD;  Location: [facility];  Service: Gynecology;  Laterality: N/A;   BREAST BIOPSY Left 05/23/2022   MM LT BREAST BX W LOC DEV 1ST LESION IMAGE BX SPEC STEREO GUIDE 05/23/2022 [facility]   CHOLECYSTECTOMY     DILATION AND CURETTAGE OF UTERUS     x2   LAPAROSCOPIC BILATERAL SALPINGECTOMY Bilateral 08/11/2013   Procedure: LAPAROSCOPIC BILATERAL  SALPINGECTOMY;  Surgeon: Bobie FORBES Crown de Charlynn FORBES Cary, MD;  Location: [facility];  Service: Gynecology;  Laterality: Bilateral;    Current Outpatient Medications  Medication Sig Dispense Refill   cyanocobalamin (VITAMIN B12) 1000 MCG/ML injection SMARTSIG:1 Milliliter(s) injection Once a Week     levothyroxine (SYNTHROID) 75 MCG tablet Take 75 mcg by mouth every morning.     liothyronine (CYTOMEL) 5 MCG tablet Take 20 mcg by mouth daily.     magnesium gluconate (MAGONATE) 500 MG tablet Take 500 mg by mouth at bedtime.     methylcellulose (CITRUCEL) oral powder Take 1 packet by mouth daily.     omeprazole  (PRILOSEC) 40 MG capsule Take 1 capsule (40 mg total) by mouth daily. (Patient taking differently: Take 40 mg by mouth daily. As needed) 30 capsule 3   PRESCRIPTION MEDICATION Testosterone Cream Sig: 1 click behind knee qhs     progesterone (PROMETRIUM) 200 MG capsule Take 200 mg by mouth at bedtime.     Vitamin D -Vitamin K (VITAMIN  K2-VITAMIN D3 PO)      No current facility-administered medications for this visit.    ALLERGIES: Gluten meal, Pork-derived products, and Topiramate  Family History  Problem Relation Age of Onset   Hypertension Mother    Hypertension Paternal Grandmother    Ovarian cancer Paternal Grandmother 53   Cancer Paternal Grandmother 48       Ovarian   Leukemia Father    Pulmonary embolism Father    Ovarian cancer Maternal Aunt 87       deceased age 71   Breast cancer Maternal Aunt 42   Uterine cancer Maternal Aunt        dx in her 36s   Diabetes Paternal Uncle    Diabetes Maternal Grandmother    Breast cancer Maternal Aunt 29       deceased age 72   Cancer Maternal Aunt 76       ?endometrial ca   Stroke Paternal Grandfather    Cancer Maternal Uncle        ocular cancer - melanoma   Breast cancer Paternal Aunt     Review of Systems  All other systems reviewed and are negative.   PHYSICAL EXAM:  BP 118/84 (BP Location: Left Arm, Patient Position: Sitting)   Pulse 67   Ht 5' 6.5 (1.689 m)   Wt 178 lb (80.7 kg)   SpO2 96%   BMI 28.30 kg/m     General appearance: alert, cooperative and appears stated age Head: normocephalic, without obvious abnormality, atraumatic Neck: no adenopathy, supple, symmetrical, trachea midline and thyroid  normal to inspection and palpation Lungs: clear to auscultation bilaterally Breasts: normal appearance, no masses or tenderness, No nipple retraction or dimpling, No nipple discharge or bleeding, No axillary adenopathy Heart: regular rate and rhythm Abdomen: soft, non-tender; no masses, no organomegaly Extremities: extremities normal, atraumatic, no cyanosis or edema Skin: skin color, texture, turgor normal. No rashes or lesions Lymph nodes: cervical, supraclavicular, and axillary nodes normal. Neurologic: grossly normal  Pelvic: External genitalia:  no lesions              No abnormal inguinal nodes palpated.              Urethra:  normal  appearing urethra with no masses, tenderness or lesions              Bartholins and Skenes: normal  Vagina: normal appearing vagina with normal color and discharge, no lesions              Cervix: no lesions              Pap taken: no Bimanual Exam:  Uterus:  normal size, contour, position, consistency, mobility, non-tender              Adnexa: no mass, fullness, tenderness              Rectal exam: yes.  Confirms.              Anus:  normal sphincter tone, no lesions  Chaperone was present for exam:  Kari HERO, CMA  ASSESSMENT: Well woman visit with gynecologic exam. Status post bilateral salpingectomy. Menopausal symptoms.  On progesterone and testosterone therapy. Hx endometriosis.  Hx diverticulitis and celiac disease. FH breast, uterine, ovarian cancer.  Increased risk of melanoma and kidney cancer by genetic testing.  Father with PE hx and rare blood dyscrasia.  PHQ-2-9: 0  PLAN: Mammogram screening discussed.  Due for dx breast imaging in May, 2026. Self breast awareness reviewed. Pap and HRV collected:  no.  Due in 2028. Guidelines for Calcium, Vitamin D , regular exercise program including cardiovascular and weight bearing exercise. Medication refills:  NA Has labs at [facility].  Will get a copy of her most recent labs. She will re-establish care with dermatology for skin checks.  Follow up:  yearly and prn.

## 2023-09-29 IMAGING — MG MM DIGITAL SCREENING BILAT W/ TOMO AND CAD
6 of 10 series · 6 of 30 positions shown · non-contrast
Comparison: Previous exam(s).

CLINICAL DATA: Screening.

EXAM:
DIGITAL SCREENING BILATERAL MAMMOGRAM WITH TOMOSYNTHESIS AND CAD
TECHNIQUE: Bilateral screening digital craniocaudal and mediolateral oblique
mammograms were obtained. Bilateral screening digital breast
tomosynthesis was performed. The images were evaluated with
computer-aided detection.

[R CC synth-2D (1 of 2)]
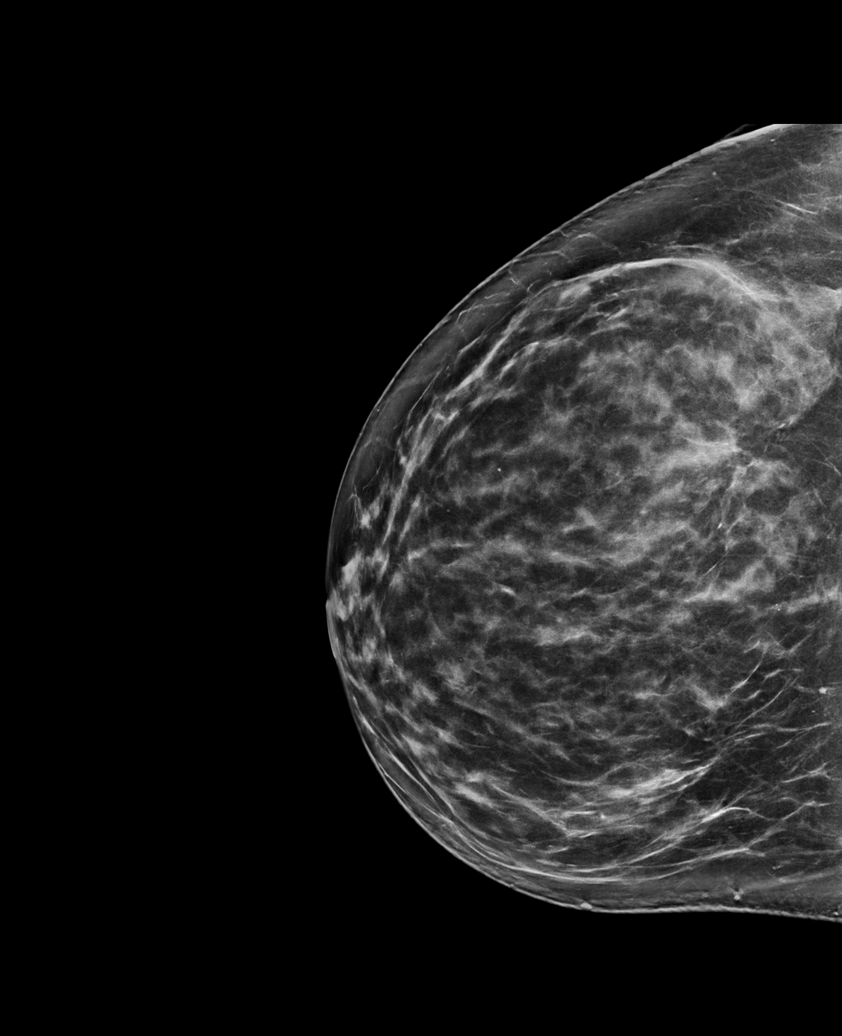

[R CC synth-2D (2 of 2)]
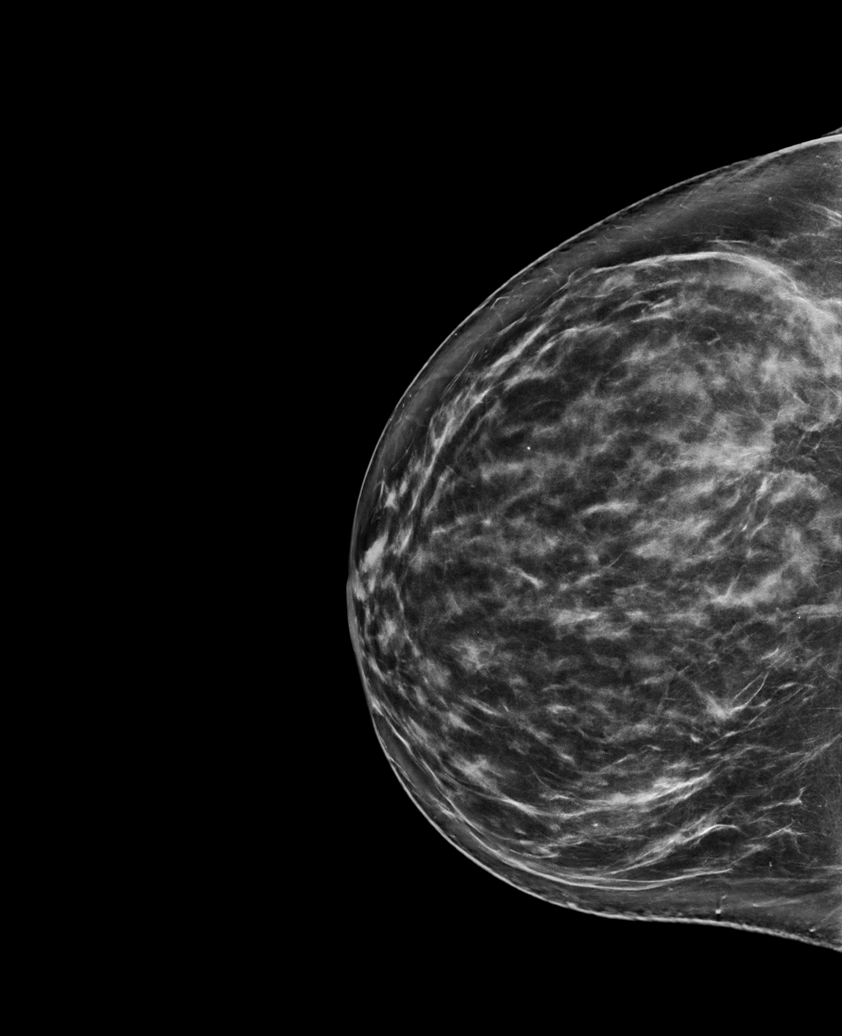

[R MLO synth-2D]
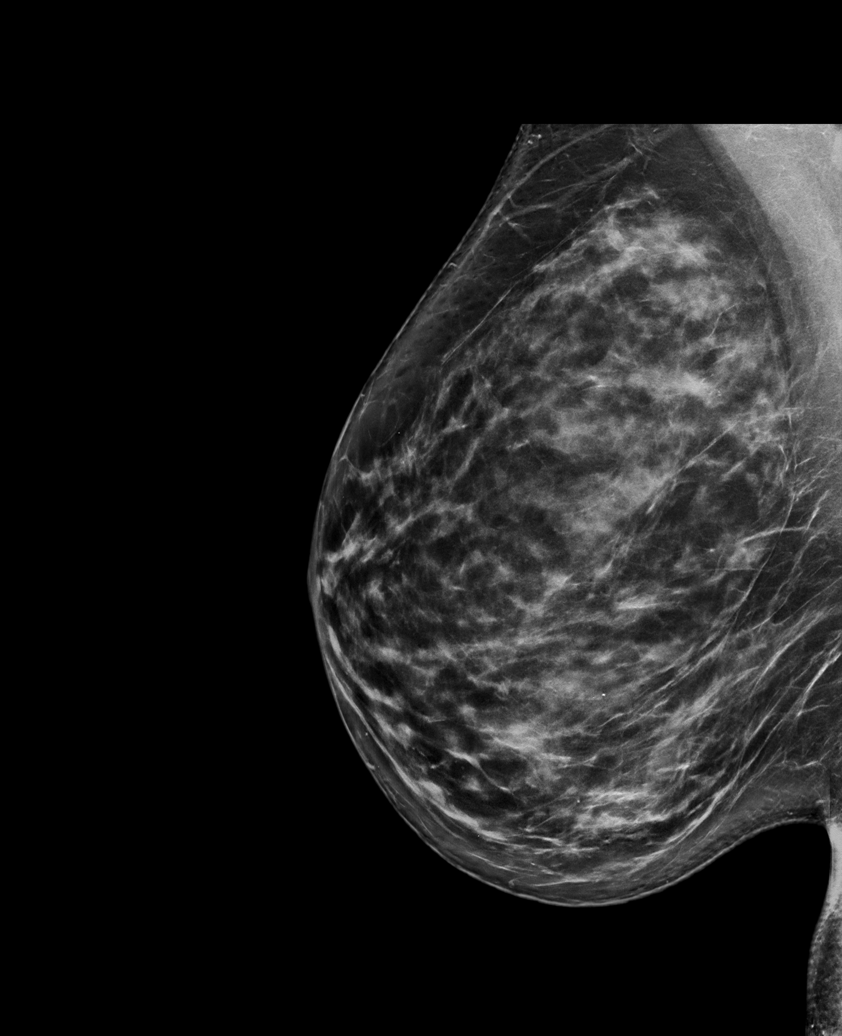

[L MLO synth-2D]
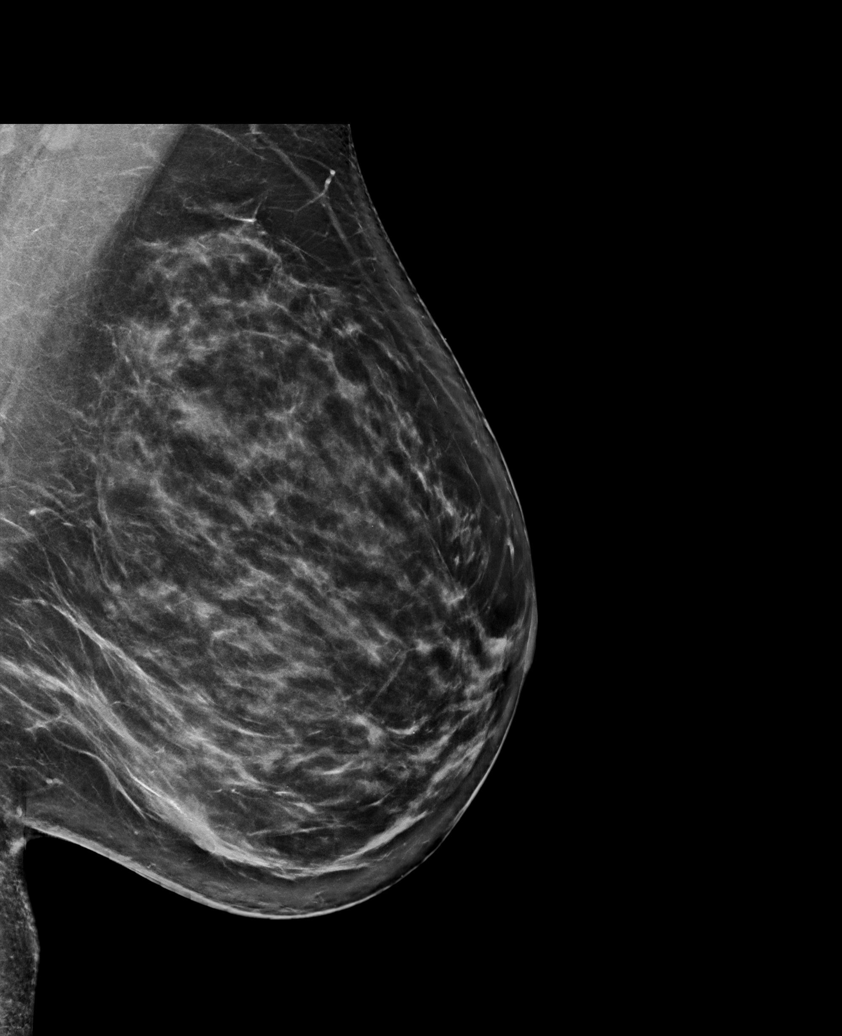

[L CC synth-2D]
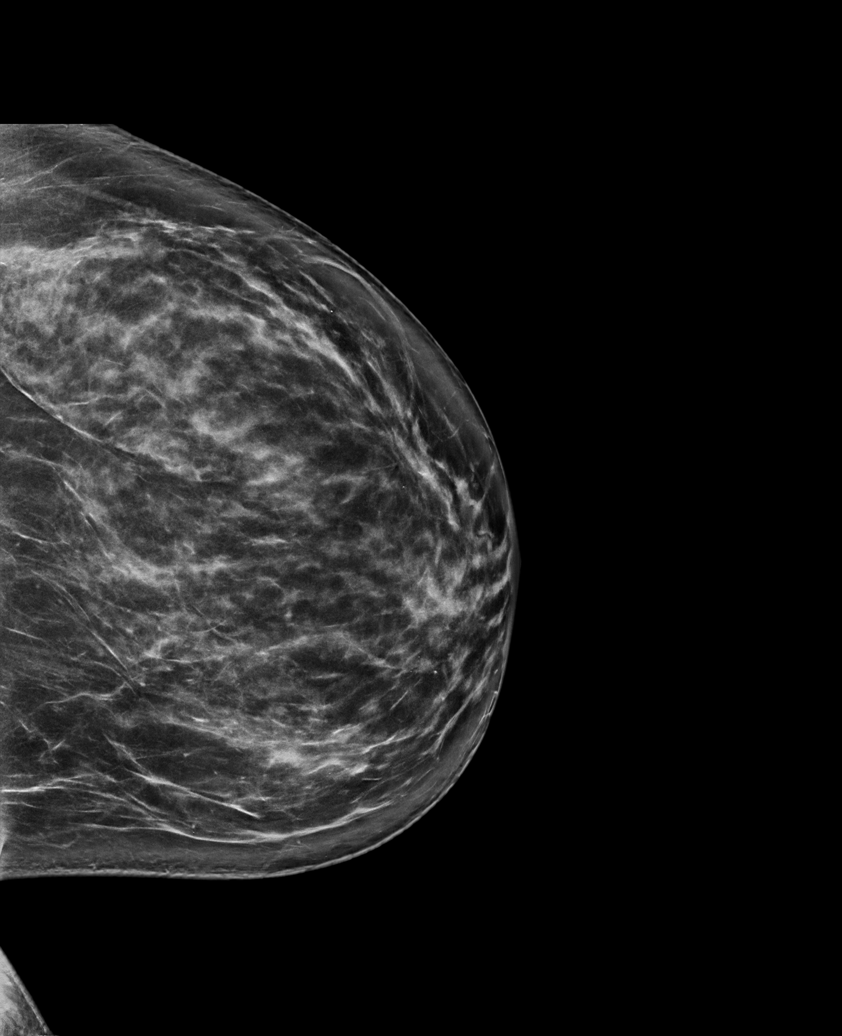

[R CC tomo · tomo slice 45/89.0]
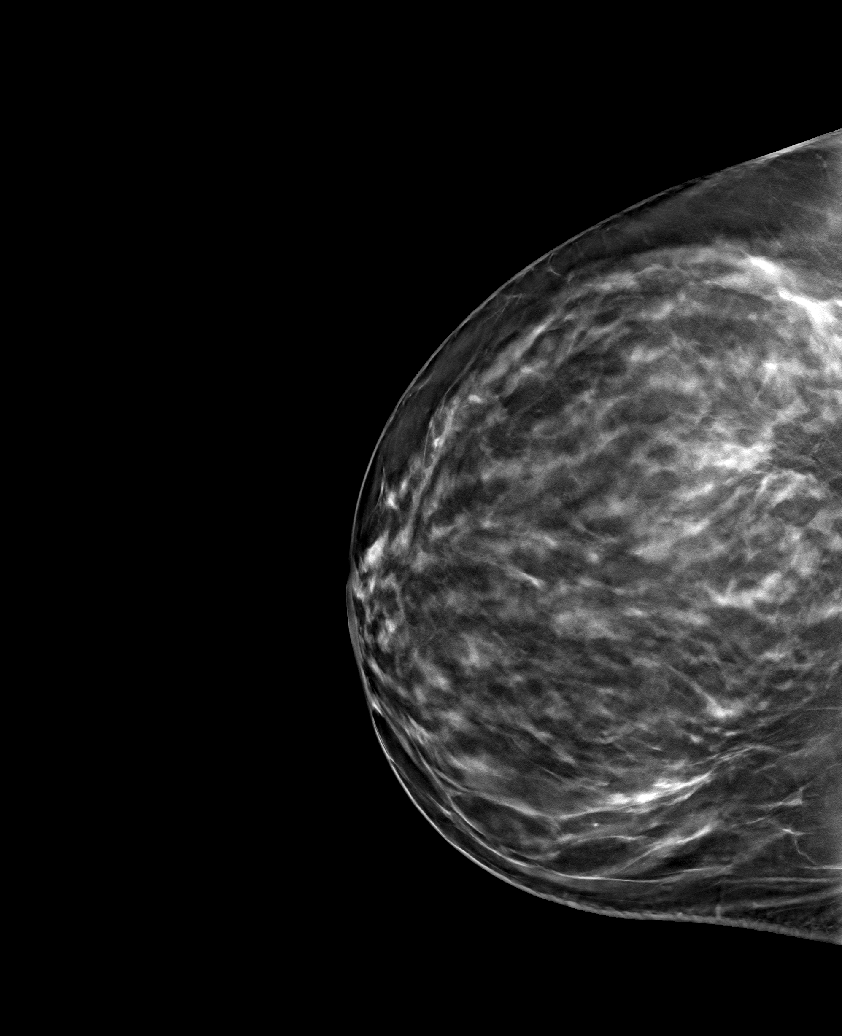

[6 of 30 positions shown; findings below may reference images not displayed]

ACR Breast Density Category c: The breast tissue is heterogeneously
dense, which may obscure small masses.
FINDINGS: There are no findings suspicious for malignancy.
IMPRESSION: No mammographic evidence of malignancy. A result letter of this
screening mammogram will be mailed directly to the patient.

RECOMMENDATION:
Screening mammogram in one year. (Code:Q3-W-BC3)

BI-RADS CATEGORY  1: Negative.

## 2023-10-02 ENCOUNTER — Ambulatory Visit (INDEPENDENT_AMBULATORY_CARE_PROVIDER_SITE_OTHER): Admitting: Obstetrics and Gynecology

## 2023-10-02 ENCOUNTER — Encounter: Payer: Self-pay | Admitting: Obstetrics and Gynecology

## 2023-10-02 VITALS — BP 118/84 | HR 67 | Ht 66.5 in | Wt 178.0 lb

## 2023-10-02 DIAGNOSIS — Z01419 Encounter for gynecological examination (general) (routine) without abnormal findings: Secondary | ICD-10-CM | POA: Diagnosis not present

## 2023-10-02 DIAGNOSIS — Z1331 Encounter for screening for depression: Secondary | ICD-10-CM

## 2023-10-02 NOTE — Patient Instructions (Signed)
